# Patient Record
Sex: Female | Born: 1935 | Race: Black or African American | Hispanic: No | Marital: Single | Smoking: Never smoker
Health system: Southern US, Community
[De-identification: ages and names within clinical notes are randomized; demographics above are authoritative.]

## PROBLEM LIST (undated history)

## (undated) DIAGNOSIS — F329 Major depressive disorder, single episode, unspecified: Secondary | ICD-10-CM

## (undated) DIAGNOSIS — C50919 Malignant neoplasm of unspecified site of unspecified female breast: Secondary | ICD-10-CM

## (undated) DIAGNOSIS — E119 Type 2 diabetes mellitus without complications: Secondary | ICD-10-CM

## (undated) DIAGNOSIS — F32A Depression, unspecified: Secondary | ICD-10-CM

## (undated) DIAGNOSIS — I639 Cerebral infarction, unspecified: Secondary | ICD-10-CM

## (undated) DIAGNOSIS — I10 Essential (primary) hypertension: Secondary | ICD-10-CM

---

## 2001-08-19 DIAGNOSIS — I639 Cerebral infarction, unspecified: Secondary | ICD-10-CM

## 2001-08-19 HISTORY — DX: Cerebral infarction, unspecified: I63.9

## 2004-06-10 ENCOUNTER — Emergency Department: Payer: Self-pay | Admitting: Internal Medicine

## 2004-06-10 ENCOUNTER — Other Ambulatory Visit: Payer: Self-pay

## 2004-06-13 ENCOUNTER — Ambulatory Visit: Payer: Self-pay | Admitting: Urology

## 2004-06-18 ENCOUNTER — Ambulatory Visit: Payer: Self-pay | Admitting: *Deleted

## 2004-06-18 ENCOUNTER — Other Ambulatory Visit: Payer: Self-pay

## 2004-06-18 ENCOUNTER — Ambulatory Visit: Payer: Self-pay | Admitting: Urology

## 2004-06-21 ENCOUNTER — Ambulatory Visit: Payer: Self-pay | Admitting: Urology

## 2004-07-05 ENCOUNTER — Ambulatory Visit: Payer: Self-pay | Admitting: *Deleted

## 2004-08-16 ENCOUNTER — Ambulatory Visit: Payer: Self-pay | Admitting: General Surgery

## 2004-08-19 DIAGNOSIS — C50919 Malignant neoplasm of unspecified site of unspecified female breast: Secondary | ICD-10-CM

## 2004-08-19 HISTORY — DX: Malignant neoplasm of unspecified site of unspecified female breast: C50.919

## 2004-08-19 HISTORY — PX: MASTECTOMY: SHX3

## 2004-09-13 ENCOUNTER — Inpatient Hospital Stay: Payer: Self-pay | Admitting: General Surgery

## 2004-10-01 ENCOUNTER — Ambulatory Visit: Payer: Self-pay | Admitting: Internal Medicine

## 2004-10-03 ENCOUNTER — Other Ambulatory Visit: Payer: Self-pay

## 2004-10-17 ENCOUNTER — Ambulatory Visit: Payer: Self-pay | Admitting: Internal Medicine

## 2004-11-13 ENCOUNTER — Ambulatory Visit: Payer: Self-pay | Admitting: General Surgery

## 2004-11-17 ENCOUNTER — Ambulatory Visit: Payer: Self-pay | Admitting: Internal Medicine

## 2004-12-17 ENCOUNTER — Ambulatory Visit: Payer: Self-pay | Admitting: Internal Medicine

## 2005-01-17 ENCOUNTER — Ambulatory Visit: Payer: Self-pay | Admitting: Internal Medicine

## 2005-02-16 ENCOUNTER — Ambulatory Visit: Payer: Self-pay | Admitting: Internal Medicine

## 2005-03-19 ENCOUNTER — Ambulatory Visit: Payer: Self-pay | Admitting: Internal Medicine

## 2005-04-19 ENCOUNTER — Ambulatory Visit: Payer: Self-pay | Admitting: Internal Medicine

## 2005-06-17 ENCOUNTER — Ambulatory Visit: Payer: Self-pay | Admitting: Internal Medicine

## 2005-06-19 ENCOUNTER — Ambulatory Visit: Payer: Self-pay | Admitting: Internal Medicine

## 2005-07-15 ENCOUNTER — Ambulatory Visit: Payer: Self-pay | Admitting: General Surgery

## 2005-08-03 ENCOUNTER — Other Ambulatory Visit: Payer: Self-pay

## 2005-08-03 ENCOUNTER — Inpatient Hospital Stay: Payer: Self-pay | Admitting: Internal Medicine

## 2005-10-16 ENCOUNTER — Ambulatory Visit: Payer: Self-pay | Admitting: Internal Medicine

## 2005-10-17 ENCOUNTER — Ambulatory Visit: Payer: Self-pay | Admitting: Internal Medicine

## 2005-11-17 ENCOUNTER — Ambulatory Visit: Payer: Self-pay | Admitting: Internal Medicine

## 2005-12-17 ENCOUNTER — Ambulatory Visit: Payer: Self-pay | Admitting: Internal Medicine

## 2006-01-17 ENCOUNTER — Ambulatory Visit: Payer: Self-pay | Admitting: Internal Medicine

## 2006-02-16 ENCOUNTER — Ambulatory Visit: Payer: Self-pay | Admitting: Internal Medicine

## 2006-03-27 ENCOUNTER — Ambulatory Visit: Payer: Self-pay | Admitting: Internal Medicine

## 2006-04-19 ENCOUNTER — Ambulatory Visit: Payer: Self-pay | Admitting: Internal Medicine

## 2006-05-19 ENCOUNTER — Ambulatory Visit: Payer: Self-pay | Admitting: Internal Medicine

## 2006-06-19 ENCOUNTER — Ambulatory Visit: Payer: Self-pay | Admitting: Internal Medicine

## 2006-07-19 ENCOUNTER — Ambulatory Visit: Payer: Self-pay | Admitting: Internal Medicine

## 2006-07-28 ENCOUNTER — Ambulatory Visit: Payer: Self-pay | Admitting: General Surgery

## 2006-09-22 ENCOUNTER — Ambulatory Visit: Payer: Self-pay | Admitting: Internal Medicine

## 2006-10-18 ENCOUNTER — Ambulatory Visit: Payer: Self-pay | Admitting: Internal Medicine

## 2007-01-15 ENCOUNTER — Encounter: Payer: Self-pay | Admitting: Nurse Practitioner

## 2007-01-18 ENCOUNTER — Encounter: Payer: Self-pay | Admitting: Nurse Practitioner

## 2007-02-17 ENCOUNTER — Encounter: Payer: Self-pay | Admitting: Nurse Practitioner

## 2007-03-19 ENCOUNTER — Ambulatory Visit: Payer: Self-pay | Admitting: Internal Medicine

## 2007-03-20 ENCOUNTER — Ambulatory Visit: Payer: Self-pay | Admitting: Internal Medicine

## 2007-03-20 ENCOUNTER — Encounter: Payer: Self-pay | Admitting: Nurse Practitioner

## 2007-04-20 ENCOUNTER — Ambulatory Visit: Payer: Self-pay | Admitting: Internal Medicine

## 2007-05-20 ENCOUNTER — Ambulatory Visit: Payer: Self-pay | Admitting: Internal Medicine

## 2007-07-20 ENCOUNTER — Ambulatory Visit: Payer: Self-pay | Admitting: Internal Medicine

## 2007-08-06 ENCOUNTER — Ambulatory Visit: Payer: Self-pay | Admitting: Internal Medicine

## 2007-08-20 ENCOUNTER — Ambulatory Visit: Payer: Self-pay | Admitting: Internal Medicine

## 2007-08-26 ENCOUNTER — Ambulatory Visit: Payer: Self-pay | Admitting: General Surgery

## 2007-09-20 ENCOUNTER — Ambulatory Visit: Payer: Self-pay | Admitting: Internal Medicine

## 2007-10-18 ENCOUNTER — Ambulatory Visit: Payer: Self-pay | Admitting: Internal Medicine

## 2007-11-04 ENCOUNTER — Ambulatory Visit: Payer: Self-pay | Admitting: Internal Medicine

## 2007-11-18 ENCOUNTER — Ambulatory Visit: Payer: Self-pay | Admitting: Internal Medicine

## 2008-04-19 ENCOUNTER — Ambulatory Visit: Payer: Self-pay | Admitting: Internal Medicine

## 2008-05-02 ENCOUNTER — Ambulatory Visit: Payer: Self-pay | Admitting: Internal Medicine

## 2008-05-19 ENCOUNTER — Ambulatory Visit: Payer: Self-pay | Admitting: Internal Medicine

## 2008-10-17 ENCOUNTER — Ambulatory Visit: Payer: Self-pay | Admitting: Internal Medicine

## 2008-10-20 ENCOUNTER — Ambulatory Visit: Payer: Self-pay | Admitting: Internal Medicine

## 2008-11-17 ENCOUNTER — Ambulatory Visit: Payer: Self-pay | Admitting: Internal Medicine

## 2009-02-01 ENCOUNTER — Inpatient Hospital Stay: Payer: Self-pay | Admitting: Internal Medicine

## 2009-03-19 ENCOUNTER — Ambulatory Visit: Payer: Self-pay | Admitting: Internal Medicine

## 2009-04-06 ENCOUNTER — Ambulatory Visit: Payer: Self-pay | Admitting: Internal Medicine

## 2009-04-19 ENCOUNTER — Ambulatory Visit: Payer: Self-pay | Admitting: Internal Medicine

## 2009-09-11 ENCOUNTER — Ambulatory Visit: Payer: Self-pay | Admitting: Internal Medicine

## 2009-09-19 ENCOUNTER — Ambulatory Visit: Payer: Self-pay | Admitting: Internal Medicine

## 2009-10-05 ENCOUNTER — Ambulatory Visit: Payer: Self-pay | Admitting: Internal Medicine

## 2009-10-17 ENCOUNTER — Ambulatory Visit: Payer: Self-pay | Admitting: Internal Medicine

## 2010-01-15 ENCOUNTER — Inpatient Hospital Stay: Payer: Self-pay | Admitting: Internal Medicine

## 2010-04-02 ENCOUNTER — Inpatient Hospital Stay: Payer: Self-pay | Admitting: Internal Medicine

## 2010-09-12 ENCOUNTER — Ambulatory Visit: Payer: Self-pay | Admitting: Internal Medicine

## 2010-10-04 ENCOUNTER — Ambulatory Visit: Payer: Self-pay | Admitting: Internal Medicine

## 2010-10-05 LAB — CANCER ANTIGEN 27.29: CA 27.29: 37.1 U/mL (ref 0.0–38.6)

## 2010-10-18 ENCOUNTER — Ambulatory Visit: Payer: Self-pay | Admitting: Internal Medicine

## 2011-09-16 ENCOUNTER — Ambulatory Visit: Payer: Self-pay | Admitting: Internal Medicine

## 2011-10-24 ENCOUNTER — Ambulatory Visit: Payer: Self-pay | Admitting: Internal Medicine

## 2011-10-24 LAB — CBC CANCER CENTER
Basophil #: 0.1 x10 3/mm (ref 0.0–0.1)
Eosinophil #: 0.1 x10 3/mm (ref 0.0–0.7)
Lymphocyte #: 1.5 x10 3/mm (ref 1.0–3.6)
Lymphocyte %: 17.3 %
MCHC: 31.2 g/dL — ABNORMAL LOW (ref 32.0–36.0)
MCV: 77 fL — ABNORMAL LOW (ref 80–100)
Monocyte #: 0.6 x10 3/mm (ref 0.0–0.7)
Monocyte %: 7.3 %
Neutrophil #: 6.5 x10 3/mm (ref 1.4–6.5)
Neutrophil %: 74 %
RDW: 16.8 % — ABNORMAL HIGH (ref 11.5–14.5)

## 2011-10-24 LAB — CREATININE, SERUM
Creatinine: 0.97 mg/dL (ref 0.60–1.30)
EGFR (African American): >60

## 2011-10-24 LAB — HEPATIC FUNCTION PANEL A (ARMC)
Albumin: 3.3 g/dL — ABNORMAL LOW (ref 3.4–5.0)
Bilirubin, Direct: 0.1 mg/dL (ref 0.00–0.20)
Bilirubin,Total: 0.3 mg/dL (ref 0.2–1.0)
SGOT(AST): 17 U/L (ref 15–37)

## 2011-10-24 LAB — IRON AND TIBC
Iron Bind.Cap.(Total): 391 ug/dL (ref 250–450)
Iron: 21 ug/dL — ABNORMAL LOW (ref 50–170)

## 2011-10-24 LAB — FERRITIN: Ferritin (ARMC): 10 ng/mL (ref 8–388)

## 2011-11-13 ENCOUNTER — Ambulatory Visit: Payer: Self-pay | Admitting: Internal Medicine

## 2011-11-18 ENCOUNTER — Ambulatory Visit: Payer: Self-pay | Admitting: Internal Medicine

## 2011-11-19 ENCOUNTER — Ambulatory Visit: Payer: Self-pay | Admitting: Otolaryngology

## 2011-11-27 ENCOUNTER — Ambulatory Visit: Payer: Self-pay | Admitting: Otolaryngology

## 2011-12-19 ENCOUNTER — Ambulatory Visit: Payer: Self-pay | Admitting: Internal Medicine

## 2012-01-18 ENCOUNTER — Ambulatory Visit: Payer: Self-pay | Admitting: Internal Medicine

## 2012-03-19 ENCOUNTER — Ambulatory Visit: Payer: Self-pay | Admitting: Internal Medicine

## 2012-03-19 LAB — CBC CANCER CENTER
Basophil %: 1.2 %
Eosinophil #: 0.1 x10 3/mm (ref 0.0–0.7)
Lymphocyte #: 1.2 x10 3/mm (ref 1.0–3.6)
MCH: 28.5 pg (ref 26.0–34.0)
MCHC: 32.3 g/dL (ref 32.0–36.0)
MCV: 88 fL (ref 80–100)
Monocyte #: 0.4 x10 3/mm (ref 0.2–0.9)
Neutrophil %: 70.4 %
Platelet: 255 x10 3/mm (ref 150–440)

## 2012-03-19 LAB — HEPATIC FUNCTION PANEL A (ARMC)
Albumin: 3.5 g/dL (ref 3.4–5.0)
Bilirubin,Total: 0.3 mg/dL (ref 0.2–1.0)
SGOT(AST): 13 U/L — ABNORMAL LOW (ref 15–37)
SGPT (ALT): 22 U/L
Total Protein: 7.5 g/dL (ref 6.4–8.2)

## 2012-03-19 LAB — CREATININE, SERUM
EGFR (African American): >60
EGFR (Non-African Amer.): >60

## 2012-03-19 LAB — IRON AND TIBC
Iron Bind.Cap.(Total): 282 ug/dL (ref 250–450)
Iron: 50 ug/dL (ref 50–170)
Unbound Iron-Bind.Cap.: 232 ug/dL

## 2012-03-20 LAB — CANCER ANTIGEN 27.29: CA 27.29: 59.9 U/mL — ABNORMAL HIGH (ref 0.0–38.6)

## 2012-03-26 ENCOUNTER — Ambulatory Visit: Payer: Self-pay

## 2012-04-19 ENCOUNTER — Ambulatory Visit: Payer: Self-pay | Admitting: Internal Medicine

## 2012-06-25 ENCOUNTER — Ambulatory Visit: Payer: Self-pay | Admitting: Internal Medicine

## 2012-07-09 LAB — CBC CANCER CENTER
Basophil %: 1.1 %
Eosinophil #: 0.1 x10 3/mm (ref 0.0–0.7)
Eosinophil %: 1.4 %
HCT: 35.8 % (ref 35.0–47.0)
HGB: 12 g/dL (ref 12.0–16.0)
MCH: 29.3 pg (ref 26.0–34.0)
MCHC: 33.4 g/dL (ref 32.0–36.0)
MCV: 88 fL (ref 80–100)
Monocyte %: 7.1 %
Neutrophil #: 6.4 x10 3/mm (ref 1.4–6.5)
RBC: 4.08 10*6/uL (ref 3.80–5.20)
WBC: 8.7 x10 3/mm (ref 3.6–11.0)

## 2012-07-09 LAB — HEPATIC FUNCTION PANEL A (ARMC)
Bilirubin,Total: 0.3 mg/dL (ref 0.2–1.0)
SGPT (ALT): 18 U/L (ref 12–78)
Total Protein: 7.6 g/dL (ref 6.4–8.2)

## 2012-07-09 LAB — CREATININE, SERUM
Creatinine: 0.96 mg/dL (ref 0.60–1.30)
EGFR (African American): >60

## 2012-07-16 IMAGING — US US THYROID
1 series · 17 of 25 positions shown · non-contrast
Comparison: none

REASON FOR EXAM: thyroid nodule
COMMENTS:

[Series 1: us thyroid · 17 of 35 slices shown]
[im 1/35]
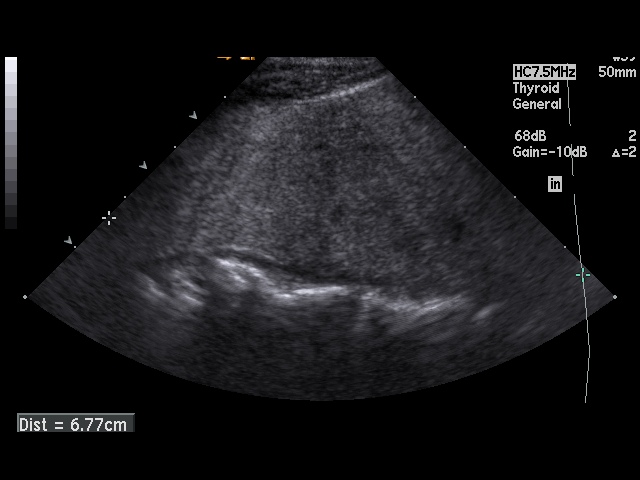
[im 3/35]
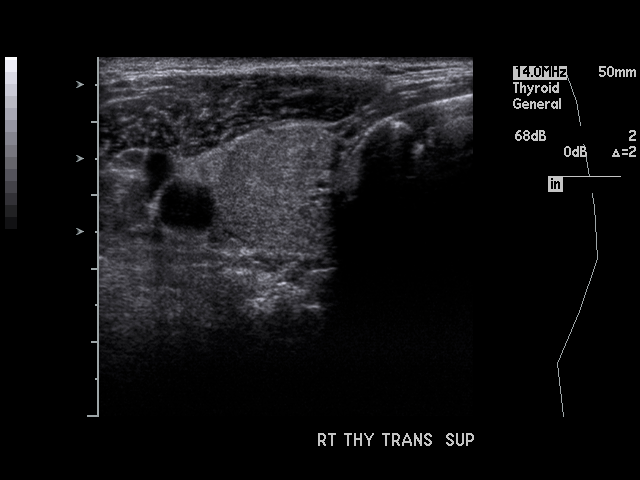
[im 5/35]
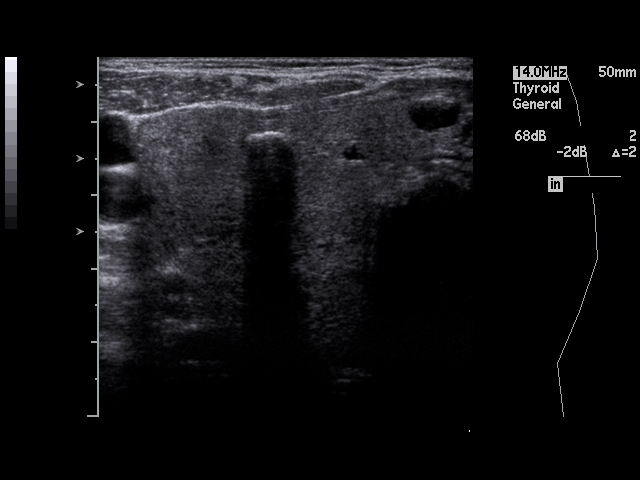
[im 8/35]
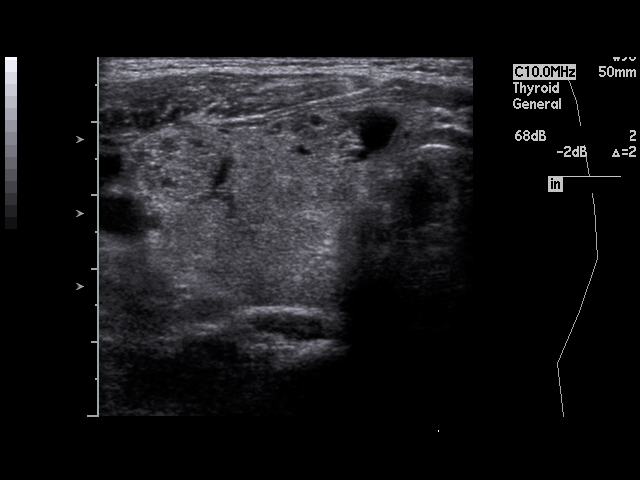
[im 9/35]
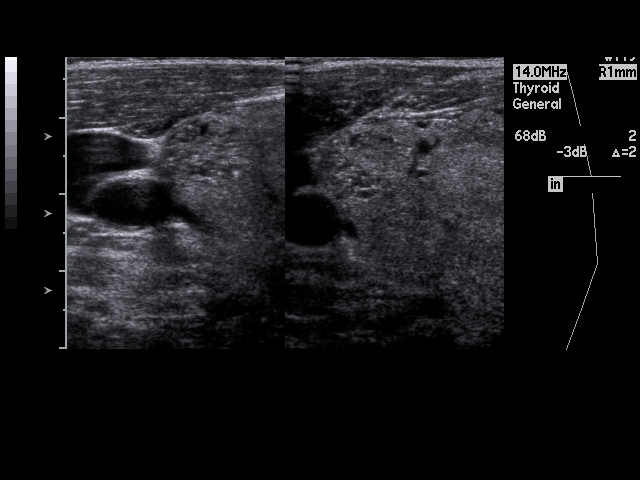
[im 12/35]
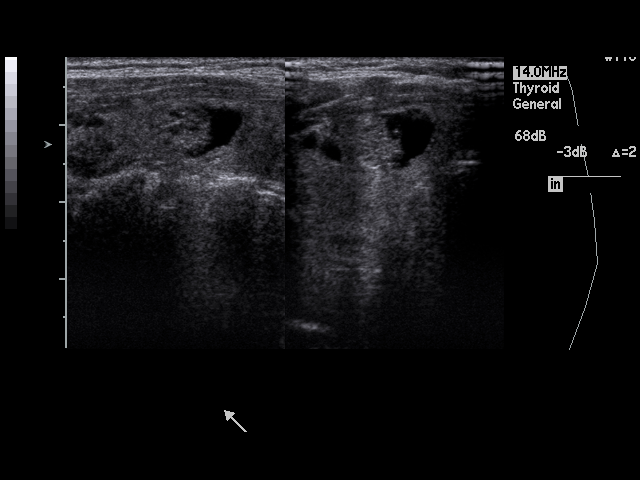
[im 13/35]
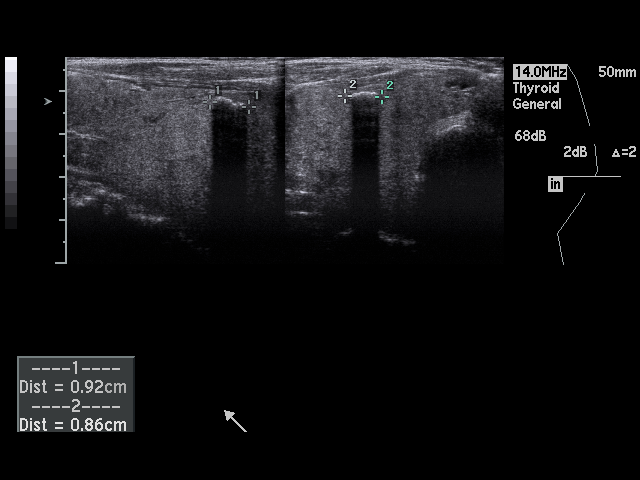
[im 16/35]
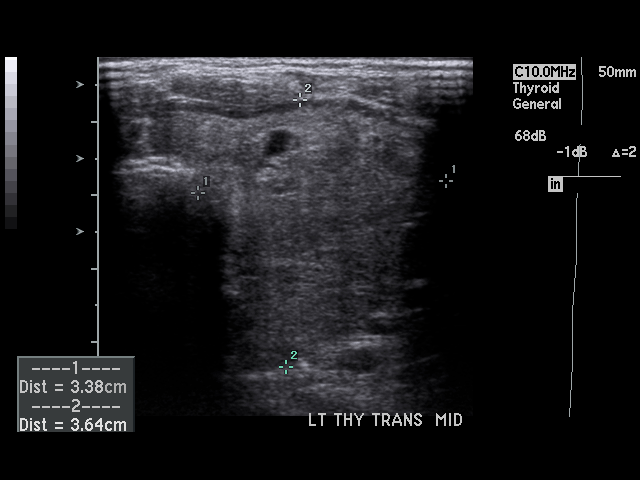
[im 18/35]
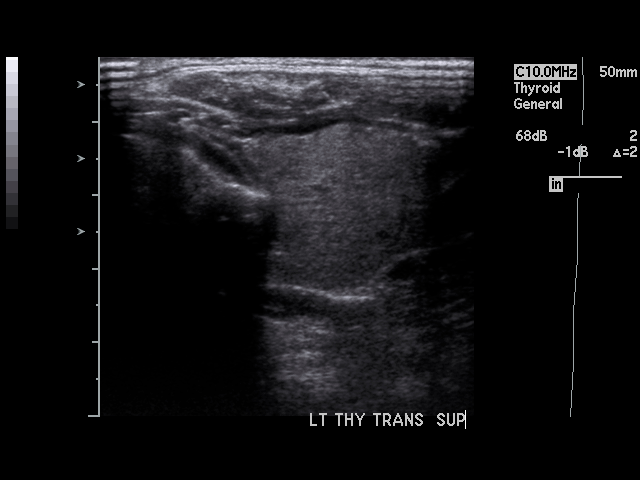
[im 19/35]
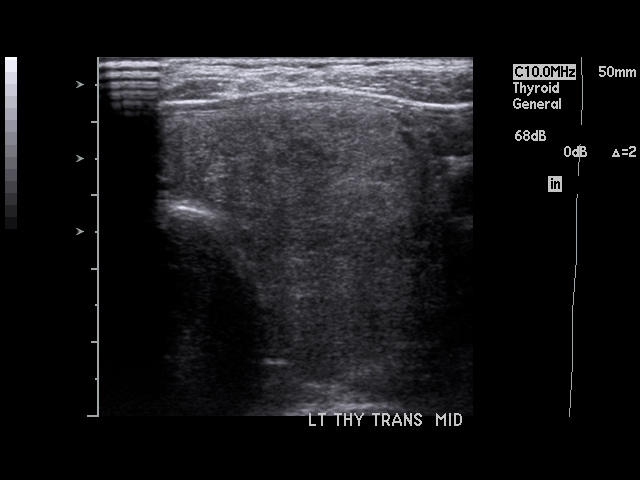
[im 22/35]
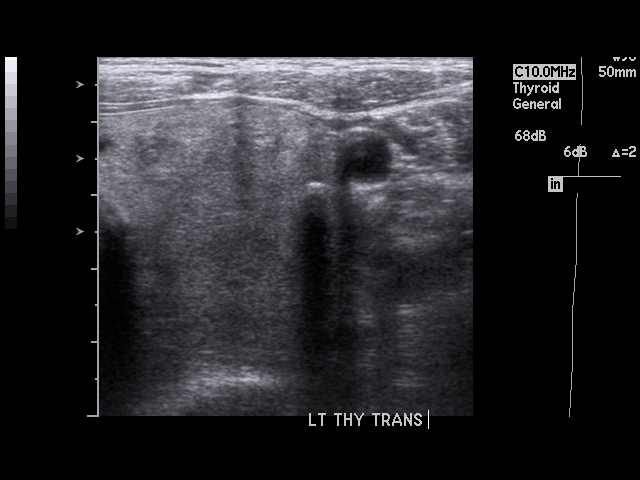
[im 23/35]
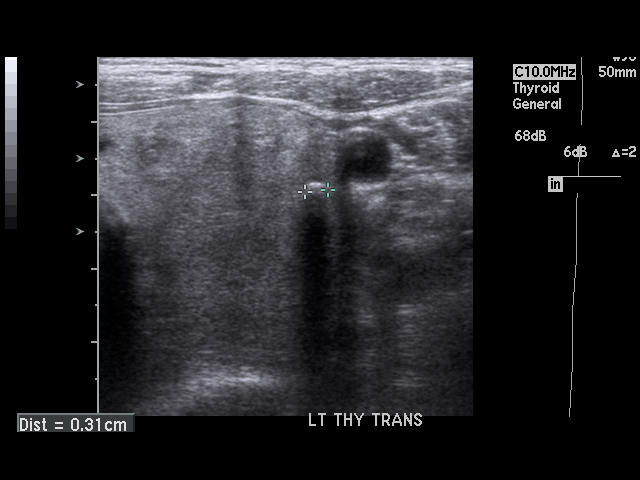
[im 26/35]
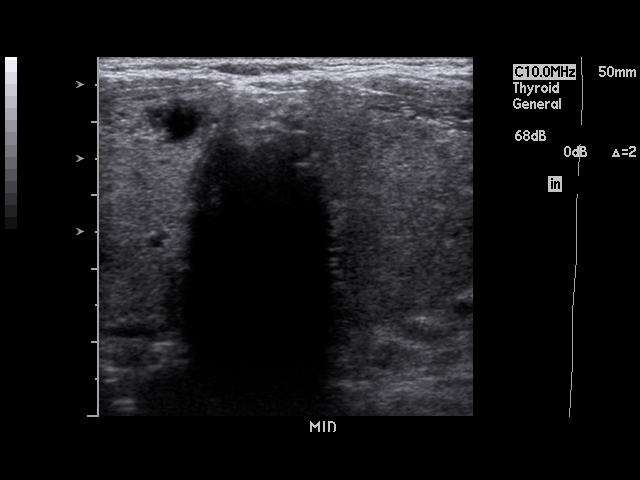
[im 27/35]
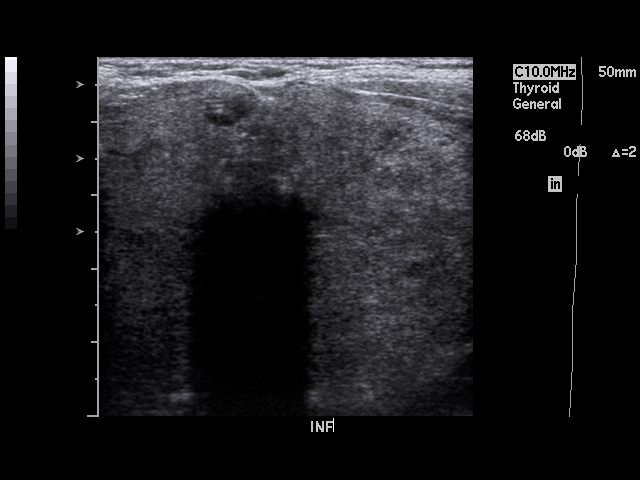
[im 30/35]
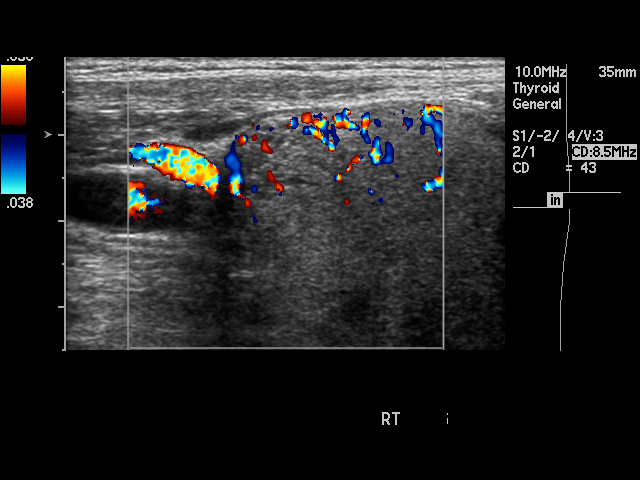
[im 32/35]
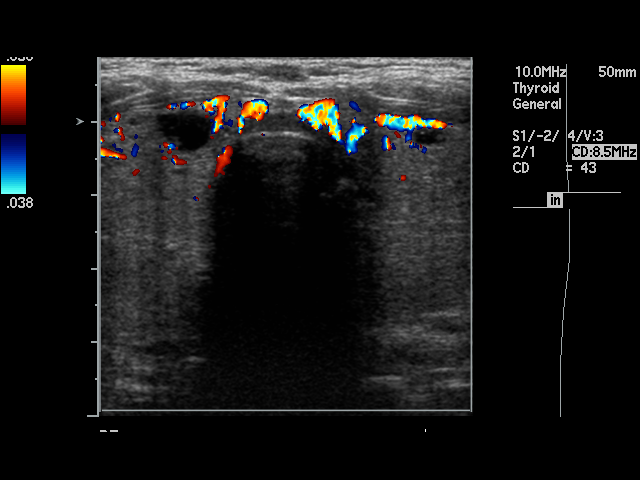
[im 35/35]
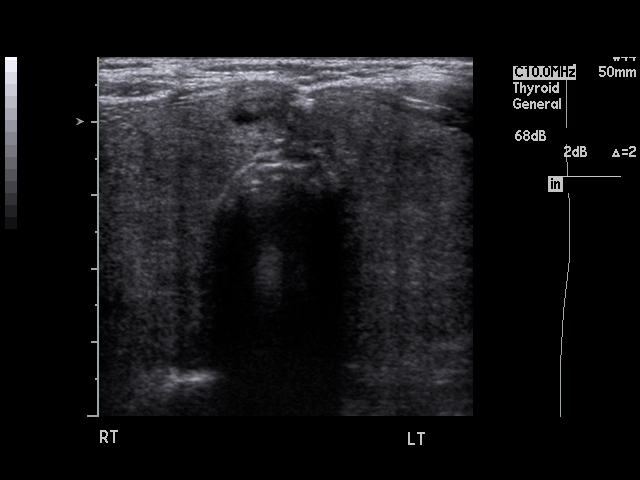

[17 of 25 positions shown; findings below may reference images not displayed]

PROCEDURE:     US  - US THYROID  - November 27, 2011 [DATE]

RESULT:     The right lobe of the thyroid measures 6.7 cm x 3 cm x 3.5 cm
and the left lobe measures 6.7 cm x 3.3 cm x 3.6 cm. There is a coarse
mm calcification in the isthmus. At the lower pole of the right lobe there
is a coarse 9.2 mm calcification and in the left lobe there is a focal
mm calcification. No associated mass is identified with any of the
calcifications. In the midpole region of the right lobe there is a
hypoechoic, solid-appearing 1.56 cm oval nodule containing a few tiny cystic
areas but with no internal calcifications seen. More medially in the right
lobe there is observed a 1.62 cm complex mass containing a few more
prominent cystic areas. No associated microcalcifications are seen in the
second nodule. No additional nodules or masses are seen. The thyroid
echotexture bilaterally and appears mildly heterogeneous.
IMPRESSION: 1. The thyroid lobes appear large and heterogeneous.
2. There are two small nodules in the midpole of the right lobe as mentioned
above. No additional defined thyroid nodules are seen.
3. There are a few coarse, benign-appearing calcifications observed in each
lobe and in the isthmus.

## 2012-07-19 ENCOUNTER — Ambulatory Visit: Payer: Self-pay | Admitting: Internal Medicine

## 2012-08-19 ENCOUNTER — Ambulatory Visit: Payer: Self-pay | Admitting: Internal Medicine

## 2012-08-27 ENCOUNTER — Other Ambulatory Visit (HOSPITAL_COMMUNITY): Payer: Self-pay | Admitting: Cardiothoracic Surgery

## 2012-08-27 DIAGNOSIS — J9859 Other diseases of mediastinum, not elsewhere classified: Secondary | ICD-10-CM

## 2012-09-17 ENCOUNTER — Other Ambulatory Visit: Payer: Self-pay | Admitting: Radiology

## 2012-09-18 ENCOUNTER — Ambulatory Visit (HOSPITAL_COMMUNITY): Payer: Medicare Other

## 2012-09-18 ENCOUNTER — Ambulatory Visit (HOSPITAL_COMMUNITY): Payer: Self-pay

## 2012-09-18 ENCOUNTER — Encounter (HOSPITAL_COMMUNITY): Payer: Self-pay | Admitting: Pharmacist

## 2012-09-19 ENCOUNTER — Ambulatory Visit: Payer: Self-pay | Admitting: Internal Medicine

## 2012-09-21 ENCOUNTER — Encounter (HOSPITAL_COMMUNITY): Payer: Self-pay

## 2012-09-21 ENCOUNTER — Ambulatory Visit (HOSPITAL_COMMUNITY)
Admission: RE | Admit: 2012-09-21 | Discharge: 2012-09-21 | Disposition: A | Discharge status: Home / Self Care | Payer: Medicare (Managed Care) | Source: Ambulatory Visit | Attending: Cardiothoracic Surgery | Admitting: Cardiothoracic Surgery

## 2012-09-21 DIAGNOSIS — I10 Essential (primary) hypertension: Secondary | ICD-10-CM | POA: Insufficient documentation

## 2012-09-21 DIAGNOSIS — Z79899 Other long term (current) drug therapy: Secondary | ICD-10-CM | POA: Insufficient documentation

## 2012-09-21 DIAGNOSIS — Z853 Personal history of malignant neoplasm of breast: Secondary | ICD-10-CM | POA: Insufficient documentation

## 2012-09-21 DIAGNOSIS — Z8673 Personal history of transient ischemic attack (TIA), and cerebral infarction without residual deficits: Secondary | ICD-10-CM | POA: Insufficient documentation

## 2012-09-21 DIAGNOSIS — J9859 Other diseases of mediastinum, not elsewhere classified: Secondary | ICD-10-CM

## 2012-09-21 DIAGNOSIS — C781 Secondary malignant neoplasm of mediastinum: Secondary | ICD-10-CM | POA: Insufficient documentation

## 2012-09-21 DIAGNOSIS — E119 Type 2 diabetes mellitus without complications: Secondary | ICD-10-CM | POA: Insufficient documentation

## 2012-09-21 HISTORY — DX: Depression, unspecified: F32.A

## 2012-09-21 HISTORY — DX: Type 2 diabetes mellitus without complications: E11.9

## 2012-09-21 HISTORY — DX: Major depressive disorder, single episode, unspecified: F32.9

## 2012-09-21 HISTORY — DX: Malignant neoplasm of unspecified site of unspecified female breast: C50.919

## 2012-09-21 HISTORY — DX: Cerebral infarction, unspecified: I63.9

## 2012-09-21 HISTORY — DX: Essential (primary) hypertension: I10

## 2012-09-21 LAB — CBC
HCT: 37.4 % (ref 36.0–46.0)
Hemoglobin: 12.1 g/dL (ref 12.0–15.0)
MCH: 28.1 pg (ref 26.0–34.0)
MCV: 87 fL (ref 78.0–100.0)
RBC: 4.3 MIL/uL (ref 3.87–5.11)
WBC: 7 10*3/uL (ref 4.0–10.5)

## 2012-09-21 LAB — GLUCOSE, CAPILLARY: Glucose-Capillary: 115 mg/dL — ABNORMAL HIGH (ref 70–99)

## 2012-09-21 MED ORDER — SODIUM CHLORIDE 0.9 % IV SOLN: Freq: Once | INTRAVENOUS | Status: DC

## 2012-09-21 MED ORDER — MIDAZOLAM HCL 2 MG/2ML IJ SOLN
INTRAMUSCULAR | Status: AC
  Filled 2012-09-21: qty 6

## 2012-09-21 MED ORDER — HYDROCODONE-ACETAMINOPHEN 5-325 MG PO TABS
1.0000 | ORAL_TABLET | ORAL | Status: DC | PRN
  Filled 2012-09-21: qty 2

## 2012-09-21 MED ORDER — MIDAZOLAM HCL 2 MG/2ML IJ SOLN
INTRAMUSCULAR | Status: AC | PRN
  Administered 2012-09-21: 1 mg via INTRAVENOUS
  Administered 2012-09-21: 0.5 mg via INTRAVENOUS
  Administered 2012-09-21: 1 mg via INTRAVENOUS

## 2012-09-21 MED ORDER — FENTANYL CITRATE 0.05 MG/ML IJ SOLN
INTRAMUSCULAR | Status: AC
  Filled 2012-09-21: qty 6

## 2012-09-21 MED ORDER — FENTANYL CITRATE 0.05 MG/ML IJ SOLN
INTRAMUSCULAR | Status: AC | PRN
  Administered 2012-09-21 (×3): 50 ug via INTRAVENOUS

## 2012-09-21 NOTE — H&P (Signed)
Agree.  For biopsy of anterior mediastinal mass today.

## 2012-09-21 NOTE — Procedures (Signed)
Procedure:  CT guided core biopsy of mediastinal mass Findings:  3 cm anterior mediastinal mass.  17 G needle advanced under CT guidance from right parasternal approach.  Solid 18 G core biopsies x 3 submitted in saline.

## 2012-09-21 NOTE — H&P (Signed)
Chief Complaint: "I'm here for a biopsy" Referring Physician:[facility] HPI: Brittany Camacho is an 79 y.o. female with hx of breast cancer on the right. She is now found to have rising tumor markers and CT imaging finds a retrosternal mass. She has been referred for biopsy and imaging has been reviewed and pt scheduled. She reports feeling well otherwise. PMHx and meds reviewed. She has held her Plavix since 1/27.  Past Medical History:  Past Medical History  Diagnosis Date  . Breast cancer 2006    right  . CVA (cerebral infarction) 2003    (L)hemiparesis, mostly recovered  . Diabetes mellitus   . Hypertension   . Depression     Past Surgical History:  Past Surgical History  Procedure Date  . Mastectomy 2006    right    Family History:  Family History  Problem Relation Age of Onset  . Breast cancer      Social History:  reports that she has never smoked. She has never used smokeless tobacco. She reports that she does not drink alcohol or use illicit drugs.  Allergies: No Known Allergies  Medications: acetaminophen (TYLENOL) 500 MG tablet (Taking) Sig - Route: Take 500 mg by mouth every 8 (eight) hours as needed. Pain - Oral Class: Historical Med Number of times this order has been changed since signing: 1 [address] atorvastatin (LIPITOR) 40 MG tablet (Taking) Sig - Route: Take 40 mg by mouth daily. - Oral Class: Historical Med Number of times this order has been changed since signing: 1 Order [facility] clopidogrel (PLAVIX) 75 MG tablet (Taking) Sig - Route: Take 75 mg by mouth daily. - Oral Class: Historical Med Number of times this order has been changed since signing: 1 Order [facility] ferrous sulfate 325 (65 FE) MG tablet (Taking) Sig - Route: Take 650 mg by mouth daily with breakfast. - Oral Class: Historical Med Number of times this order has been changed since signing: 1 Order [facility] glipiZIDE (GLUCOTROL XL) 10 MG 24 hr tablet (Taking) Sig - Route: Take 10 mg by  mouth daily with breakfast. - Oral Class: Historical Med Number of times this order has been changed since signing: 1 Order [facility] [address] guaiFENesin-dextromethorphan (ROBITUSSIN DM) 100-10 MG/5ML syrup (Taking) Sig - Route: Take 5 mLs by mouth every 4 (four) hours as needed. cough - Oral Class: Historical Med Number of times this order has been changed since signing: 1 Order [facility] [address] HYDROcodone-acetaminophen (NORCO/VICODIN) 5-325 MG per tablet (Taking) Sig - Route: Take 1 tablet by mouth at bedtime as needed. For shoulder pain. - Oral Class: Historical Med Number of times this order has been changed since signing: 1 Order [facility] insulin glargine (LANTUS) 100 UNIT/ML injection (Taking) Sig - Route: Inject 28 Units into the skin daily. - Subcutaneous Class: Historical Med Number of times this order has been changed since signing: 1 Order [facility] [address] insulin regular (NOVOLIN R,HUMULIN R) 100 units/mL injection (Taking) Sig - Route: Inject 5 Units into the skin as needed. "5 units if sugar is greater than 300 in clinic." - Subcutaneous Class: Historical Med Number of times this order has been changed since signing: 1 Order [facility] [address] lisinopril-hydrochlorothiazide (PRINZIDE,ZESTORETIC) 20-12.5 MG per tablet (Taking) Sig - Route: Take 1 tablet by mouth daily. - Oral Class: Historical Med Number of times this order has been changed since signing: 1 Order [facility] loperamide (IMODIUM A-D) 2 MG tablet (Taking) Sig - Route: Take 2-4 mg by mouth See admin instructions.  Takes one tablet every day, then also takes 1-2 tablets after each loose stool as needed. - Oral Class: Historical Med Number of times this order has been changed since signing: 1 Order [facility] metoprolol succinate (TOPROL-XL) 25 MG 24 hr tablet (Taking) Sig - Route: Take 25 mg by mouth daily. - Oral Class: Historical Med Number of times this order has been changed since signing: 1 Order [facility] pantoprazole (PROTONIX) 40 MG tablet (Taking)  Sig - Route: Take 40 mg by mouth daily before breakfast. - Oral Class: Historical Med Number of times this order has been changed since signing: 1 Order [address] potassium chloride (K-DUR,KLOR-CON) 10 MEQ tablet (Taking) Sig - Route: Take 10 mEq by mouth daily. - Oral Class: Historical Med Number of times this order has been changed since signing: 1 Order [facility] [address] Vitamin D, Ergocalciferol, (DRISDOL) 50000 UNITS CAPS (Taking) Sig - Route: Take 50,000 Units by mouth every 30 (thirty) days. Takes on the first Monday of each month. - Oral   Please HPI for pertinent positives, otherwise complete 10 system ROS negative.  Physical Exam: Blood pressure 121/68, pulse 79, temperature 97.3 F (36.3 C), temperature source Oral, resp. rate 16, height 5\' 7"  (1.702 m), weight 172 lb (78.019 kg), SpO2 92.00%. Body mass index is 26.94 kg/(m^2).   General Appearance:  Alert, cooperative, no distress, appears stated age  Head:  Normocephalic, without obvious abnormality, atraumatic  ENT: Unremarkable  Neck: Supple, symmetrical, trachea midline, palpable NT thyroid enlarged  Lungs:   Clear to auscultation bilaterally, no w/r/r, respirations unlabored without use of accessory muscles.  Chest Wall:  No tenderness or deformity  Heart:  Regular rate and rhythm, S1, S2 normal, no murmur, rub or gallop. Carotids 2+ without bruit.  Neurologic: Normal affect, no gross deficits.   Results for orders placed during the hospital encounter of 09/21/12 (from the past 48 hour(s))  CBC     Status: Normal   Collection Time   09/21/12 10:10 AM      Component Value Range Comment   WBC 7.0  4.0 - 10.5 K/uL    RBC 4.30  3.87 - 5.11 MIL/uL    Hemoglobin 12.1  12.0 - 15.0 g/dL    HCT 16.1  09.6 - 04.5 %    MCV 87.0  78.0 - 100.0 fL    MCH 28.1  26.0 - 34.0 pg    MCHC 32.4  30.0 - 36.0 g/dL    RDW 40.9  81.1 - 91.4 %    Platelets 291  150 - 400 K/uL    No results found.  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 Discussed procedure and risks, complications, use of sedation. Labs pending, for review. Consent signed in chart  Brayton El PA-C 09/21/2012, 10:52 AM

## 2012-09-22 ENCOUNTER — Ambulatory Visit (HOSPITAL_COMMUNITY): Payer: Medicare Other

## 2012-09-22 ENCOUNTER — Ambulatory Visit (HOSPITAL_COMMUNITY)
Admission: RE | Admit: 2012-09-22 | Discharge: 2012-09-22 | Disposition: A | Discharge status: Home / Self Care | Payer: Medicare Other | Source: Ambulatory Visit | Attending: Cardiothoracic Surgery | Admitting: Cardiothoracic Surgery

## 2012-10-15 LAB — CBC CANCER CENTER
Eosinophil #: 0.1 x10 3/mm (ref 0.0–0.7)
Eosinophil %: 1.7 %
HCT: 36.2 % (ref 35.0–47.0)
HGB: 11.8 g/dL — ABNORMAL LOW (ref 12.0–16.0)
Lymphocyte #: 1.5 x10 3/mm (ref 1.0–3.6)
MCH: 28.1 pg (ref 26.0–34.0)
MCHC: 32.6 g/dL (ref 32.0–36.0)
MCV: 86 fL (ref 80–100)
Monocyte #: 0.6 x10 3/mm (ref 0.2–0.9)
Neutrophil %: 68.9 %
Platelet: 241 x10 3/mm (ref 150–440)
RBC: 4.18 10*6/uL (ref 3.80–5.20)
RDW: 13.6 % (ref 11.5–14.5)
WBC: 7.6 x10 3/mm (ref 3.6–11.0)

## 2012-10-15 LAB — HEPATIC FUNCTION PANEL A (ARMC)
Albumin: 3.4 g/dL (ref 3.4–5.0)
Alkaline Phosphatase: 88 U/L (ref 50–136)
Bilirubin, Direct: 0.1 mg/dL (ref 0.00–0.20)
SGPT (ALT): 22 U/L (ref 12–78)
Total Protein: 7.6 g/dL (ref 6.4–8.2)

## 2012-10-15 LAB — CREATININE, SERUM
Creatinine: 0.99 mg/dL (ref 0.60–1.30)
EGFR (African American): >60

## 2012-10-15 LAB — IRON AND TIBC
Iron Bind.Cap.(Total): 274 ug/dL (ref 250–450)
Iron: 77 ug/dL (ref 50–170)

## 2012-10-17 ENCOUNTER — Ambulatory Visit: Payer: Self-pay | Admitting: Internal Medicine

## 2013-01-14 ENCOUNTER — Ambulatory Visit: Payer: Self-pay | Admitting: Internal Medicine

## 2013-01-14 LAB — CBC CANCER CENTER
Basophil %: 0.9 %
Eosinophil #: 0.1 x10 3/mm (ref 0.0–0.7)
Eosinophil %: 1 %
HGB: 11.1 g/dL — ABNORMAL LOW (ref 12.0–16.0)
MCH: 27.4 pg (ref 26.0–34.0)
MCHC: 31.3 g/dL — ABNORMAL LOW (ref 32.0–36.0)
Neutrophil %: 76.2 %
Platelet: 251 x10 3/mm (ref 150–440)
RBC: 4.04 10*6/uL (ref 3.80–5.20)
RDW: 13.8 % (ref 11.5–14.5)

## 2013-01-14 LAB — CREATININE, SERUM
Creatinine: 0.96 mg/dL (ref 0.60–1.30)
EGFR (African American): >60

## 2013-01-14 LAB — HEPATIC FUNCTION PANEL A (ARMC)
Albumin: 3.4 g/dL (ref 3.4–5.0)
Alkaline Phosphatase: 75 U/L (ref 50–136)
Bilirubin, Direct: 0.1 mg/dL (ref 0.00–0.20)
SGOT(AST): 27 U/L (ref 15–37)
SGPT (ALT): 32 U/L (ref 12–78)

## 2013-01-17 ENCOUNTER — Ambulatory Visit: Payer: Self-pay | Admitting: Internal Medicine

## 2013-02-16 ENCOUNTER — Ambulatory Visit: Payer: Self-pay | Admitting: Internal Medicine

## 2013-04-15 ENCOUNTER — Ambulatory Visit: Payer: Self-pay | Admitting: Internal Medicine

## 2013-04-15 LAB — CREATININE, SERUM
Creatinine: 1.11 mg/dL (ref 0.60–1.30)
EGFR (African American): 56 — ABNORMAL LOW
EGFR (Non-African Amer.): 48 — ABNORMAL LOW

## 2013-04-15 LAB — CBC CANCER CENTER
Basophil #: 0.1 x10 3/mm (ref 0.0–0.1)
Basophil %: 1.1 %
Eosinophil %: 1.2 %
HGB: 11.8 g/dL — ABNORMAL LOW (ref 12.0–16.0)
Lymphocyte #: 1.3 x10 3/mm (ref 1.0–3.6)
MCH: 27.8 pg (ref 26.0–34.0)
MCHC: 32 g/dL (ref 32.0–36.0)
MCV: 87 fL (ref 80–100)
Monocyte #: 0.6 x10 3/mm (ref 0.2–0.9)
Monocyte %: 7.9 %
Neutrophil #: 6 x10 3/mm (ref 1.4–6.5)
Neutrophil %: 73.5 %
Platelet: 258 x10 3/mm (ref 150–440)
RBC: 4.23 10*6/uL (ref 3.80–5.20)
WBC: 8.2 x10 3/mm (ref 3.6–11.0)

## 2013-04-15 LAB — HEPATIC FUNCTION PANEL A (ARMC)
Bilirubin, Direct: 0.1 mg/dL (ref 0.00–0.20)
Bilirubin,Total: 0.4 mg/dL (ref 0.2–1.0)

## 2013-04-19 ENCOUNTER — Ambulatory Visit: Payer: Self-pay | Admitting: Internal Medicine

## 2013-07-06 ENCOUNTER — Ambulatory Visit: Payer: Self-pay | Admitting: Internal Medicine

## 2013-07-06 LAB — CBC CANCER CENTER
Basophil #: 0.1 x10 3/mm (ref 0.0–0.1)
Basophil %: 1 %
HGB: 10.4 g/dL — ABNORMAL LOW (ref 12.0–16.0)
Lymphocyte #: 1.6 x10 3/mm (ref 1.0–3.6)
Lymphocyte %: 17 %
MCHC: 31.3 g/dL — ABNORMAL LOW (ref 32.0–36.0)
MCV: 90 fL (ref 80–100)
Monocyte #: 0.9 x10 3/mm (ref 0.2–0.9)
Monocyte %: 9.3 %
Neutrophil #: 6.7 x10 3/mm — ABNORMAL HIGH (ref 1.4–6.5)
Neutrophil %: 71.2 %
Platelet: 307 x10 3/mm (ref 150–440)
RBC: 3.71 10*6/uL — ABNORMAL LOW (ref 3.80–5.20)
RDW: 14 % (ref 11.5–14.5)

## 2013-07-06 LAB — CREATININE, SERUM
Creatinine: 1.18 mg/dL (ref 0.60–1.30)
EGFR (African American): 52 — ABNORMAL LOW

## 2013-07-06 LAB — HEPATIC FUNCTION PANEL A (ARMC)
Alkaline Phosphatase: 77 U/L (ref 50–136)
SGOT(AST): 22 U/L (ref 15–37)
Total Protein: 7.5 g/dL (ref 6.4–8.2)

## 2013-07-19 ENCOUNTER — Ambulatory Visit: Payer: Self-pay | Admitting: Internal Medicine

## 2013-08-04 LAB — CBC CANCER CENTER
Basophil %: 1.2 %
Eosinophil #: 0.1 x10 3/mm (ref 0.0–0.7)
HCT: 34.4 % — ABNORMAL LOW (ref 35.0–47.0)
Lymphocyte #: 1 x10 3/mm (ref 1.0–3.6)
Lymphocyte %: 11.9 %
MCH: 27.9 pg (ref 26.0–34.0)
MCV: 88 fL (ref 80–100)
Platelet: 277 x10 3/mm (ref 150–440)
RBC: 3.9 10*6/uL (ref 3.80–5.20)
RDW: 13.7 % (ref 11.5–14.5)

## 2013-08-04 LAB — HEPATIC FUNCTION PANEL A (ARMC)
Alkaline Phosphatase: 68 U/L
SGOT(AST): 28 U/L (ref 15–37)
SGPT (ALT): 28 U/L (ref 12–78)

## 2013-08-04 LAB — CREATININE, SERUM: Creatinine: 1.44 mg/dL — ABNORMAL HIGH (ref 0.60–1.30)

## 2013-08-10 LAB — CBC CANCER CENTER
Basophil #: 0 x10 3/mm (ref 0.0–0.1)
Eosinophil #: 0.3 x10 3/mm (ref 0.0–0.7)
HCT: 35 % (ref 35.0–47.0)
HGB: 10.9 g/dL — ABNORMAL LOW (ref 12.0–16.0)
Lymphocyte #: 0.8 x10 3/mm — ABNORMAL LOW (ref 1.0–3.6)
Lymphocyte %: 11.2 %
MCH: 27.5 pg (ref 26.0–34.0)
Neutrophil %: 76.7 %
Platelet: 271 x10 3/mm (ref 150–440)
RDW: 13.4 % (ref 11.5–14.5)
WBC: 7.3 x10 3/mm (ref 3.6–11.0)

## 2013-08-17 LAB — CBC CANCER CENTER
Basophil #: 0.1 x10 3/mm (ref 0.0–0.1)
Eosinophil #: 0.3 x10 3/mm (ref 0.0–0.7)
Eosinophil %: 4 %
HGB: 10.4 g/dL — ABNORMAL LOW (ref 12.0–16.0)
Lymphocyte #: 0.6 x10 3/mm — ABNORMAL LOW (ref 1.0–3.6)
Lymphocyte %: 8.6 %
MCH: 27.6 pg (ref 26.0–34.0)
MCHC: 31.4 g/dL — ABNORMAL LOW (ref 32.0–36.0)
Monocyte #: 0.7 x10 3/mm (ref 0.2–0.9)
Monocyte %: 9 %
Neutrophil %: 77.6 %
Platelet: 248 x10 3/mm (ref 150–440)
RDW: 13.4 % (ref 11.5–14.5)
WBC: 7.6 x10 3/mm (ref 3.6–11.0)

## 2013-08-19 ENCOUNTER — Ambulatory Visit: Payer: Self-pay | Admitting: Internal Medicine

## 2013-08-31 LAB — CBC CANCER CENTER
BASOS PCT: 0.8 %
Basophil #: 0.1 x10 3/mm (ref 0.0–0.1)
Eosinophil #: 0.2 x10 3/mm (ref 0.0–0.7)
Eosinophil %: 2.9 %
HCT: 34.6 % — ABNORMAL LOW (ref 35.0–47.0)
HGB: 11 g/dL — ABNORMAL LOW (ref 12.0–16.0)
LYMPHS PCT: 7.5 %
Lymphocyte #: 0.5 x10 3/mm — ABNORMAL LOW (ref 1.0–3.6)
MCH: 27.6 pg (ref 26.0–34.0)
MCHC: 31.8 g/dL — ABNORMAL LOW (ref 32.0–36.0)
MCV: 87 fL (ref 80–100)
MONO ABS: 0.6 x10 3/mm (ref 0.2–0.9)
Monocyte %: 8.9 %
Neutrophil #: 5.7 x10 3/mm (ref 1.4–6.5)
Neutrophil %: 79.9 %
PLATELETS: 237 x10 3/mm (ref 150–440)
RBC: 4 10*6/uL (ref 3.80–5.20)
RDW: 14 % (ref 11.5–14.5)
WBC: 7.1 x10 3/mm (ref 3.6–11.0)

## 2013-09-19 ENCOUNTER — Ambulatory Visit: Payer: Self-pay | Admitting: Internal Medicine

## 2013-09-29 LAB — CREATININE, SERUM
CREATININE: 0.91 mg/dL (ref 0.60–1.30)
EGFR (Non-African Amer.): >60

## 2013-09-29 LAB — CBC CANCER CENTER
BASOS PCT: 1.5 %
Basophil #: 0.1 x10 3/mm (ref 0.0–0.1)
EOS ABS: 0.2 x10 3/mm (ref 0.0–0.7)
EOS PCT: 4.1 %
HCT: 36.3 % (ref 35.0–47.0)
HGB: 11.5 g/dL — ABNORMAL LOW (ref 12.0–16.0)
Lymphocyte #: 0.6 x10 3/mm — ABNORMAL LOW (ref 1.0–3.6)
Lymphocyte %: 12.7 %
MCH: 27.3 pg (ref 26.0–34.0)
MCHC: 31.6 g/dL — ABNORMAL LOW (ref 32.0–36.0)
MCV: 86 fL (ref 80–100)
Monocyte #: 0.6 x10 3/mm (ref 0.2–0.9)
Monocyte %: 11.7 %
Neutrophil #: 3.4 x10 3/mm (ref 1.4–6.5)
Neutrophil %: 70 %
PLATELETS: 237 x10 3/mm (ref 150–440)
RBC: 4.21 10*6/uL (ref 3.80–5.20)
RDW: 14.2 % (ref 11.5–14.5)
WBC: 4.9 x10 3/mm (ref 3.6–11.0)

## 2013-09-29 LAB — HEPATIC FUNCTION PANEL A (ARMC)
ALK PHOS: 70 U/L
ALT: 25 U/L (ref 12–78)
Albumin: 3.4 g/dL (ref 3.4–5.0)
BILIRUBIN DIRECT: 0.1 mg/dL (ref 0.00–0.20)
BILIRUBIN TOTAL: 0.2 mg/dL (ref 0.2–1.0)
SGOT(AST): 27 U/L (ref 15–37)
Total Protein: 7.8 g/dL (ref 6.4–8.2)

## 2013-10-17 ENCOUNTER — Ambulatory Visit: Payer: Self-pay | Admitting: Internal Medicine

## 2013-10-28 LAB — CANCER ANTIGEN 27.29: CA 27.29: 283.5 U/mL — AB (ref 0.0–38.6)

## 2013-11-16 LAB — CBC CANCER CENTER
Basophil #: 0.1 x10 3/mm (ref 0.0–0.1)
Basophil %: 1.4 %
Eosinophil #: 0.3 x10 3/mm (ref 0.0–0.7)
Eosinophil %: 4.8 %
HCT: 35.5 % (ref 35.0–47.0)
HGB: 11.2 g/dL — AB (ref 12.0–16.0)
LYMPHS ABS: 0.8 x10 3/mm — AB (ref 1.0–3.6)
LYMPHS PCT: 11.3 %
MCH: 27.4 pg (ref 26.0–34.0)
MCHC: 31.6 g/dL — AB (ref 32.0–36.0)
MCV: 87 fL (ref 80–100)
MONOS PCT: 9.8 %
Monocyte #: 0.7 x10 3/mm (ref 0.2–0.9)
NEUTROS PCT: 72.7 %
Neutrophil #: 4.9 x10 3/mm (ref 1.4–6.5)
PLATELETS: 240 x10 3/mm (ref 150–440)
RBC: 4.09 10*6/uL (ref 3.80–5.20)
RDW: 15 % — ABNORMAL HIGH (ref 11.5–14.5)
WBC: 6.8 x10 3/mm (ref 3.6–11.0)

## 2013-11-16 LAB — COMPREHENSIVE METABOLIC PANEL
ALK PHOS: 68 U/L
Albumin: 3.4 g/dL (ref 3.4–5.0)
Anion Gap: 9 (ref 7–16)
BUN: 17 mg/dL (ref 7–18)
Bilirubin,Total: 0.4 mg/dL (ref 0.2–1.0)
CO2: 29 mmol/L (ref 21–32)
Calcium, Total: 10 mg/dL (ref 8.5–10.1)
Chloride: 104 mmol/L (ref 98–107)
Creatinine: 0.99 mg/dL (ref 0.60–1.30)
EGFR (Non-African Amer.): 55 — ABNORMAL LOW
Glucose: 166 mg/dL — ABNORMAL HIGH (ref 65–99)
OSMOLALITY: 288 (ref 275–301)
Potassium: 3.9 mmol/L (ref 3.5–5.1)
SGOT(AST): 24 U/L (ref 15–37)
SGPT (ALT): 18 U/L (ref 12–78)
Sodium: 142 mmol/L (ref 136–145)
TOTAL PROTEIN: 7.8 g/dL (ref 6.4–8.2)

## 2013-11-17 ENCOUNTER — Ambulatory Visit: Payer: Self-pay | Admitting: Internal Medicine

## 2013-11-23 LAB — CBC CANCER CENTER
BASOS PCT: 1.2 %
Basophil #: 0.1 x10 3/mm (ref 0.0–0.1)
EOS ABS: 0.1 x10 3/mm (ref 0.0–0.7)
Eosinophil %: 1.4 %
HCT: 34.4 % — ABNORMAL LOW (ref 35.0–47.0)
HGB: 10.8 g/dL — ABNORMAL LOW (ref 12.0–16.0)
LYMPHS PCT: 13.7 %
Lymphocyte #: 0.9 x10 3/mm — ABNORMAL LOW (ref 1.0–3.6)
MCH: 27.4 pg (ref 26.0–34.0)
MCHC: 31.4 g/dL — ABNORMAL LOW (ref 32.0–36.0)
MCV: 87 fL (ref 80–100)
Monocyte #: 0.4 x10 3/mm (ref 0.2–0.9)
Monocyte %: 5.9 %
NEUTROS ABS: 4.9 x10 3/mm (ref 1.4–6.5)
Neutrophil %: 77.8 %
PLATELETS: 267 x10 3/mm (ref 150–440)
RBC: 3.94 10*6/uL (ref 3.80–5.20)
RDW: 14.4 % (ref 11.5–14.5)
WBC: 6.3 x10 3/mm (ref 3.6–11.0)

## 2013-12-07 LAB — CBC CANCER CENTER
BASOS ABS: 0.1 x10 3/mm (ref 0.0–0.1)
BASOS PCT: 1.3 %
Eosinophil #: 0 x10 3/mm (ref 0.0–0.7)
Eosinophil %: 0.5 %
HCT: 33.8 % — AB (ref 35.0–47.0)
HGB: 10.7 g/dL — AB (ref 12.0–16.0)
Lymphocyte #: 0.8 x10 3/mm — ABNORMAL LOW (ref 1.0–3.6)
Lymphocyte %: 14.5 %
MCH: 27.5 pg (ref 26.0–34.0)
MCHC: 31.6 g/dL — ABNORMAL LOW (ref 32.0–36.0)
MCV: 87 fL (ref 80–100)
MONO ABS: 0.8 x10 3/mm (ref 0.2–0.9)
MONOS PCT: 14.4 %
Neutrophil #: 3.7 x10 3/mm (ref 1.4–6.5)
Neutrophil %: 69.3 %
PLATELETS: 260 x10 3/mm (ref 150–440)
RBC: 3.88 10*6/uL (ref 3.80–5.20)
RDW: 14.9 % — AB (ref 11.5–14.5)
WBC: 5.4 x10 3/mm (ref 3.6–11.0)

## 2013-12-07 LAB — HEPATIC FUNCTION PANEL A (ARMC)
ALT: 26 U/L (ref 12–78)
Albumin: 3.3 g/dL — ABNORMAL LOW (ref 3.4–5.0)
Alkaline Phosphatase: 60 U/L
BILIRUBIN DIRECT: 0.1 mg/dL (ref 0.00–0.20)
Bilirubin,Total: 0.3 mg/dL (ref 0.2–1.0)
SGOT(AST): 35 U/L (ref 15–37)
Total Protein: 7.2 g/dL (ref 6.4–8.2)

## 2013-12-07 LAB — CREATININE, SERUM
CREATININE: 1.62 mg/dL — AB (ref 0.60–1.30)
GFR CALC AF AMER: 35 — AB
GFR CALC NON AF AMER: 30 — AB

## 2013-12-17 ENCOUNTER — Ambulatory Visit: Payer: Self-pay | Admitting: Internal Medicine

## 2013-12-28 LAB — CBC CANCER CENTER
BASOS ABS: 0.1 x10 3/mm (ref 0.0–0.1)
Basophil %: 0.8 %
EOS ABS: 0 x10 3/mm (ref 0.0–0.7)
Eosinophil %: 0.4 %
HCT: 35.8 % (ref 35.0–47.0)
HGB: 11.6 g/dL — ABNORMAL LOW (ref 12.0–16.0)
Lymphocyte #: 0.9 x10 3/mm — ABNORMAL LOW (ref 1.0–3.6)
Lymphocyte %: 13.8 %
MCH: 28.4 pg (ref 26.0–34.0)
MCHC: 32.5 g/dL (ref 32.0–36.0)
MCV: 88 fL (ref 80–100)
MONOS PCT: 14 %
Monocyte #: 0.9 x10 3/mm (ref 0.2–0.9)
NEUTROS PCT: 71 %
Neutrophil #: 4.4 x10 3/mm (ref 1.4–6.5)
Platelet: 283 x10 3/mm (ref 150–440)
RBC: 4.08 10*6/uL (ref 3.80–5.20)
RDW: 15.2 % — ABNORMAL HIGH (ref 11.5–14.5)
WBC: 6.2 x10 3/mm (ref 3.6–11.0)

## 2013-12-28 LAB — HEPATIC FUNCTION PANEL A (ARMC)
ALBUMIN: 3.5 g/dL (ref 3.4–5.0)
ALK PHOS: 68 U/L
Bilirubin, Direct: 0.1 mg/dL (ref 0.00–0.20)
Bilirubin,Total: 0.4 mg/dL (ref 0.2–1.0)
SGOT(AST): 39 U/L — ABNORMAL HIGH (ref 15–37)
SGPT (ALT): 25 U/L (ref 12–78)
Total Protein: 7.7 g/dL (ref 6.4–8.2)

## 2013-12-28 LAB — CREATININE, SERUM
CREATININE: 1.01 mg/dL (ref 0.60–1.30)
EGFR (African American): >60
EGFR (Non-African Amer.): 54 — ABNORMAL LOW

## 2013-12-28 LAB — CALCIUM: Calcium, Total: 9.3 mg/dL (ref 8.5–10.1)

## 2014-01-17 ENCOUNTER — Ambulatory Visit: Payer: Self-pay | Admitting: Internal Medicine

## 2014-01-18 LAB — CBC CANCER CENTER
Basophil #: 0.1 x10 3/mm (ref 0.0–0.1)
Basophil %: 1.2 %
Eosinophil #: 0.1 x10 3/mm (ref 0.0–0.7)
Eosinophil %: 2.1 %
HCT: 33.2 % — ABNORMAL LOW (ref 35.0–47.0)
HGB: 10.4 g/dL — AB (ref 12.0–16.0)
Lymphocyte #: 0.7 x10 3/mm — ABNORMAL LOW (ref 1.0–3.6)
Lymphocyte %: 11 %
MCH: 28.3 pg (ref 26.0–34.0)
MCHC: 31.2 g/dL — ABNORMAL LOW (ref 32.0–36.0)
MCV: 91 fL (ref 80–100)
MONOS PCT: 10.5 %
Monocyte #: 0.6 x10 3/mm (ref 0.2–0.9)
Neutrophil #: 4.7 x10 3/mm (ref 1.4–6.5)
Neutrophil %: 75.2 %
Platelet: 263 x10 3/mm (ref 150–440)
RBC: 3.66 10*6/uL — AB (ref 3.80–5.20)
RDW: 15.3 % — ABNORMAL HIGH (ref 11.5–14.5)
WBC: 6.2 x10 3/mm (ref 3.6–11.0)

## 2014-01-18 LAB — HEPATIC FUNCTION PANEL A (ARMC)
ALT: 21 U/L (ref 12–78)
Albumin: 3.2 g/dL — ABNORMAL LOW (ref 3.4–5.0)
Alkaline Phosphatase: 62 U/L
Bilirubin, Direct: 0.1 mg/dL (ref 0.00–0.20)
Bilirubin,Total: 0.2 mg/dL (ref 0.2–1.0)
SGOT(AST): 30 U/L (ref 15–37)
Total Protein: 7 g/dL (ref 6.4–8.2)

## 2014-01-18 LAB — CREATININE, SERUM
CREATININE: 0.97 mg/dL (ref 0.60–1.30)
EGFR (African American): >60
EGFR (Non-African Amer.): 56 — ABNORMAL LOW

## 2014-02-08 LAB — HEPATIC FUNCTION PANEL A (ARMC)
ALK PHOS: 64 U/L
ALT: 24 U/L (ref 12–78)
AST: 28 U/L (ref 15–37)
Albumin: 3.4 g/dL (ref 3.4–5.0)
BILIRUBIN TOTAL: 0.2 mg/dL (ref 0.2–1.0)
TOTAL PROTEIN: 7.1 g/dL (ref 6.4–8.2)

## 2014-02-08 LAB — CBC CANCER CENTER
Basophil #: 0.1 x10 3/mm (ref 0.0–0.1)
Basophil %: 1.3 %
Eosinophil #: 0.1 x10 3/mm (ref 0.0–0.7)
Eosinophil %: 1.5 %
HCT: 34.4 % — ABNORMAL LOW (ref 35.0–47.0)
HGB: 10.9 g/dL — AB (ref 12.0–16.0)
Lymphocyte #: 0.8 x10 3/mm — ABNORMAL LOW (ref 1.0–3.6)
Lymphocyte %: 11 %
MCH: 28.6 pg (ref 26.0–34.0)
MCHC: 31.8 g/dL — ABNORMAL LOW (ref 32.0–36.0)
MCV: 90 fL (ref 80–100)
Monocyte #: 0.6 x10 3/mm (ref 0.2–0.9)
Monocyte %: 7.9 %
NEUTROS ABS: 5.9 x10 3/mm (ref 1.4–6.5)
Neutrophil %: 78.3 %
Platelet: 288 x10 3/mm (ref 150–440)
RBC: 3.83 10*6/uL (ref 3.80–5.20)
RDW: 14.8 % — AB (ref 11.5–14.5)
WBC: 7.6 x10 3/mm (ref 3.6–11.0)

## 2014-02-08 LAB — CREATININE, SERUM
Creatinine: 1.22 mg/dL (ref 0.60–1.30)
EGFR (African American): 49 — ABNORMAL LOW
GFR CALC NON AF AMER: 43 — AB

## 2014-02-16 ENCOUNTER — Ambulatory Visit: Payer: Self-pay | Admitting: Internal Medicine

## 2014-03-01 LAB — CBC CANCER CENTER
Basophil #: 0.1 x10 3/mm (ref 0.0–0.1)
Basophil %: 1.1 %
EOS PCT: 1 %
Eosinophil #: 0.1 x10 3/mm (ref 0.0–0.7)
HCT: 31.7 % — ABNORMAL LOW (ref 35.0–47.0)
HGB: 10.3 g/dL — AB (ref 12.0–16.0)
Lymphocyte #: 1.1 x10 3/mm (ref 1.0–3.6)
Lymphocyte %: 12.5 %
MCH: 29.1 pg (ref 26.0–34.0)
MCHC: 32.3 g/dL (ref 32.0–36.0)
MCV: 90 fL (ref 80–100)
MONO ABS: 0.7 x10 3/mm (ref 0.2–0.9)
MONOS PCT: 8.2 %
NEUTROS PCT: 77.2 %
Neutrophil #: 6.7 x10 3/mm — ABNORMAL HIGH (ref 1.4–6.5)
Platelet: 270 x10 3/mm (ref 150–440)
RBC: 3.53 10*6/uL — AB (ref 3.80–5.20)
RDW: 14.5 % (ref 11.5–14.5)
WBC: 8.7 x10 3/mm (ref 3.6–11.0)

## 2014-03-01 LAB — HEPATIC FUNCTION PANEL A (ARMC)
ALBUMIN: 3.3 g/dL — AB (ref 3.4–5.0)
ALK PHOS: 57 U/L
Bilirubin, Direct: <0.1 mg/dL (ref 0.00–0.20)
Bilirubin,Total: 0.3 mg/dL (ref 0.2–1.0)
SGOT(AST): 28 U/L (ref 15–37)
SGPT (ALT): 24 U/L (ref 12–78)
Total Protein: 7.4 g/dL (ref 6.4–8.2)

## 2014-03-01 LAB — CREATININE, SERUM
CREATININE: 1.02 mg/dL (ref 0.60–1.30)
GFR CALC NON AF AMER: 53 — AB

## 2014-03-08 LAB — CBC CANCER CENTER
Basophil #: 0.1 x10 3/mm (ref 0.0–0.1)
Basophil %: 1.2 %
EOS PCT: 1.4 %
Eosinophil #: 0.1 x10 3/mm (ref 0.0–0.7)
HCT: 30 % — ABNORMAL LOW (ref 35.0–47.0)
HGB: 9.3 g/dL — AB (ref 12.0–16.0)
LYMPHS ABS: 0.9 x10 3/mm — AB (ref 1.0–3.6)
LYMPHS PCT: 12.2 %
MCH: 27.8 pg (ref 26.0–34.0)
MCHC: 31.1 g/dL — AB (ref 32.0–36.0)
MCV: 89 fL (ref 80–100)
MONOS PCT: 4.1 %
Monocyte #: 0.3 x10 3/mm (ref 0.2–0.9)
NEUTROS PCT: 81.1 %
Neutrophil #: 6.2 x10 3/mm (ref 1.4–6.5)
PLATELETS: 293 x10 3/mm (ref 150–440)
RBC: 3.36 10*6/uL — AB (ref 3.80–5.20)
RDW: 14.5 % (ref 11.5–14.5)
WBC: 7.7 x10 3/mm (ref 3.6–11.0)

## 2014-03-08 LAB — CREATININE, SERUM
Creatinine: 1.06 mg/dL (ref 0.60–1.30)
GFR CALC AF AMER: 59 — AB
GFR CALC NON AF AMER: 51 — AB

## 2014-03-09 LAB — CANCER ANTIGEN 27.29: CA 27.29: 265.1 U/mL — ABNORMAL HIGH (ref 0.0–38.6)

## 2014-03-19 ENCOUNTER — Ambulatory Visit: Payer: Self-pay | Admitting: Internal Medicine

## 2014-03-23 LAB — CBC CANCER CENTER
Basophil #: 0.1 x10 3/mm (ref 0.0–0.1)
Basophil %: 1.4 %
EOS ABS: 0 x10 3/mm (ref 0.0–0.7)
Eosinophil %: 0.5 %
HCT: 31 % — ABNORMAL LOW (ref 35.0–47.0)
HGB: 9.9 g/dL — ABNORMAL LOW (ref 12.0–16.0)
LYMPHS PCT: 12.8 %
Lymphocyte #: 0.9 x10 3/mm — ABNORMAL LOW (ref 1.0–3.6)
MCH: 28.7 pg (ref 26.0–34.0)
MCHC: 31.9 g/dL — AB (ref 32.0–36.0)
MCV: 90 fL (ref 80–100)
MONO ABS: 0.7 x10 3/mm (ref 0.2–0.9)
MONOS PCT: 10.3 %
NEUTROS ABS: 5 x10 3/mm (ref 1.4–6.5)
Neutrophil %: 75 %
Platelet: 273 x10 3/mm (ref 150–440)
RBC: 3.45 10*6/uL — AB (ref 3.80–5.20)
RDW: 15.5 % — AB (ref 11.5–14.5)
WBC: 6.7 x10 3/mm (ref 3.6–11.0)

## 2014-03-23 LAB — HEPATIC FUNCTION PANEL A (ARMC)
ALBUMIN: 3.1 g/dL — AB (ref 3.4–5.0)
ALK PHOS: 53 U/L
ALT: 21 U/L
BILIRUBIN TOTAL: 0.3 mg/dL (ref 0.2–1.0)
SGOT(AST): 34 U/L (ref 15–37)
TOTAL PROTEIN: 7.1 g/dL (ref 6.4–8.2)

## 2014-03-23 LAB — CREATININE, SERUM
CREATININE: 1.47 mg/dL — AB (ref 0.60–1.30)
EGFR (Non-African Amer.): 34 — ABNORMAL LOW
GFR CALC AF AMER: 39 — AB

## 2014-03-30 LAB — CBC CANCER CENTER
Basophil #: 0.1 x10 3/mm (ref 0.0–0.1)
Basophil %: 0.9 %
Eosinophil #: 0.1 x10 3/mm (ref 0.0–0.7)
Eosinophil %: 0.8 %
HCT: 32.2 % — AB (ref 35.0–47.0)
HGB: 10 g/dL — ABNORMAL LOW (ref 12.0–16.0)
LYMPHS ABS: 0.9 x10 3/mm — AB (ref 1.0–3.6)
LYMPHS PCT: 12.4 %
MCH: 28 pg (ref 26.0–34.0)
MCHC: 31.1 g/dL — AB (ref 32.0–36.0)
MCV: 90 fL (ref 80–100)
MONOS PCT: 3.7 %
Monocyte #: 0.3 x10 3/mm (ref 0.2–0.9)
NEUTROS PCT: 82.2 %
Neutrophil #: 6.1 x10 3/mm (ref 1.4–6.5)
Platelet: 311 x10 3/mm (ref 150–440)
RBC: 3.58 10*6/uL — AB (ref 3.80–5.20)
RDW: 15.1 % — ABNORMAL HIGH (ref 11.5–14.5)
WBC: 7.4 x10 3/mm (ref 3.6–11.0)

## 2014-03-30 LAB — CREATININE, SERUM
Creatinine: 1.04 mg/dL (ref 0.60–1.30)
EGFR (African American): >60
GFR CALC NON AF AMER: 52 — AB

## 2014-04-13 LAB — HEPATIC FUNCTION PANEL A (ARMC)
ALT: 21 U/L
AST: 29 U/L (ref 15–37)
Albumin: 3 g/dL — ABNORMAL LOW (ref 3.4–5.0)
Alkaline Phosphatase: 57 U/L
BILIRUBIN TOTAL: 0.2 mg/dL (ref 0.2–1.0)
Bilirubin, Direct: <0.1 mg/dL (ref 0.00–0.20)
Total Protein: 6.9 g/dL (ref 6.4–8.2)

## 2014-04-13 LAB — CBC CANCER CENTER
Basophil #: 0.1 x10 3/mm (ref 0.0–0.1)
Basophil %: 1.4 %
Eosinophil #: 0.1 x10 3/mm (ref 0.0–0.7)
Eosinophil %: 1.3 %
HCT: 31.4 % — ABNORMAL LOW (ref 35.0–47.0)
HGB: 10 g/dL — ABNORMAL LOW (ref 12.0–16.0)
Lymphocyte #: 0.9 x10 3/mm — ABNORMAL LOW (ref 1.0–3.6)
Lymphocyte %: 14.5 %
MCH: 28.5 pg (ref 26.0–34.0)
MCHC: 31.8 g/dL — ABNORMAL LOW (ref 32.0–36.0)
MCV: 90 fL (ref 80–100)
Monocyte #: 0.7 x10 3/mm (ref 0.2–0.9)
Monocyte %: 11.1 %
Neutrophil #: 4.7 x10 3/mm (ref 1.4–6.5)
Neutrophil %: 71.7 %
Platelet: 291 x10 3/mm (ref 150–440)
RBC: 3.51 10*6/uL — ABNORMAL LOW (ref 3.80–5.20)
RDW: 15.9 % — ABNORMAL HIGH (ref 11.5–14.5)
WBC: 6.5 x10 3/mm (ref 3.6–11.0)

## 2014-04-13 LAB — CREATININE, SERUM
Creatinine: 1.33 mg/dL — ABNORMAL HIGH (ref 0.60–1.30)
EGFR (Non-African Amer.): 38 — ABNORMAL LOW
GFR CALC AF AMER: 45 — AB

## 2014-04-19 ENCOUNTER — Ambulatory Visit: Payer: Self-pay | Admitting: Internal Medicine

## 2014-04-19 LAB — CBC CANCER CENTER
BASOS ABS: 0.1 x10 3/mm (ref 0.0–0.1)
Basophil %: 1.2 %
EOS PCT: 2.3 %
Eosinophil #: 0.2 x10 3/mm (ref 0.0–0.7)
HCT: 31.7 % — ABNORMAL LOW (ref 35.0–47.0)
HGB: 10 g/dL — ABNORMAL LOW (ref 12.0–16.0)
LYMPHS ABS: 1.1 x10 3/mm (ref 1.0–3.6)
LYMPHS PCT: 10.1 %
MCH: 28.3 pg (ref 26.0–34.0)
MCHC: 31.4 g/dL — AB (ref 32.0–36.0)
MCV: 90 fL (ref 80–100)
MONO ABS: 0.4 x10 3/mm (ref 0.2–0.9)
MONOS PCT: 3.3 %
Neutrophil #: 9 x10 3/mm — ABNORMAL HIGH (ref 1.4–6.5)
Neutrophil %: 83.1 %
Platelet: 281 x10 3/mm (ref 150–440)
RBC: 3.52 10*6/uL — ABNORMAL LOW (ref 3.80–5.20)
RDW: 15.3 % — ABNORMAL HIGH (ref 11.5–14.5)
WBC: 10.8 x10 3/mm (ref 3.6–11.0)

## 2014-04-19 LAB — CREATININE, SERUM
Creatinine: 1.49 mg/dL — ABNORMAL HIGH (ref 0.60–1.30)
GFR CALC AF AMER: 39 — AB
GFR CALC NON AF AMER: 34 — AB

## 2014-05-04 LAB — CBC CANCER CENTER
BASOS PCT: 1.3 %
Basophil #: 0.1 x10 3/mm (ref 0.0–0.1)
EOS PCT: 0.4 %
Eosinophil #: 0 x10 3/mm (ref 0.0–0.7)
HCT: 31.8 % — ABNORMAL LOW (ref 35.0–47.0)
HGB: 10 g/dL — ABNORMAL LOW (ref 12.0–16.0)
LYMPHS ABS: 1 x10 3/mm (ref 1.0–3.6)
Lymphocyte %: 11.5 %
MCH: 28.2 pg (ref 26.0–34.0)
MCHC: 31.5 g/dL — ABNORMAL LOW (ref 32.0–36.0)
MCV: 90 fL (ref 80–100)
Monocyte #: 1 x10 3/mm — ABNORMAL HIGH (ref 0.2–0.9)
Monocyte %: 11.3 %
Neutrophil #: 6.7 x10 3/mm — ABNORMAL HIGH (ref 1.4–6.5)
Neutrophil %: 75.5 %
Platelet: 305 x10 3/mm (ref 150–440)
RBC: 3.56 10*6/uL — AB (ref 3.80–5.20)
RDW: 14.9 % — ABNORMAL HIGH (ref 11.5–14.5)
WBC: 8.9 x10 3/mm (ref 3.6–11.0)

## 2014-05-04 LAB — HEPATIC FUNCTION PANEL A (ARMC)
ALK PHOS: 68 U/L
Albumin: 3 g/dL — ABNORMAL LOW (ref 3.4–5.0)
BILIRUBIN TOTAL: 0.2 mg/dL (ref 0.2–1.0)
SGOT(AST): 21 U/L (ref 15–37)
SGPT (ALT): 21 U/L
Total Protein: 7.1 g/dL (ref 6.4–8.2)

## 2014-05-04 LAB — CREATININE, SERUM
Creatinine: 1.35 mg/dL — ABNORMAL HIGH (ref 0.60–1.30)
EGFR (African American): 44 — ABNORMAL LOW
EGFR (Non-African Amer.): 38 — ABNORMAL LOW

## 2014-05-13 LAB — CBC CANCER CENTER
Basophil #: 0.1 x10 3/mm (ref 0.0–0.1)
Basophil %: 1.1 %
EOS ABS: 0 x10 3/mm (ref 0.0–0.7)
Eosinophil %: 0.7 %
HCT: 32.6 % — ABNORMAL LOW (ref 35.0–47.0)
HGB: 10.2 g/dL — AB (ref 12.0–16.0)
Lymphocyte #: 1.1 x10 3/mm (ref 1.0–3.6)
Lymphocyte %: 14.8 %
MCH: 27.6 pg (ref 26.0–34.0)
MCHC: 31.2 g/dL — AB (ref 32.0–36.0)
MCV: 88 fL (ref 80–100)
MONO ABS: 0.5 x10 3/mm (ref 0.2–0.9)
Monocyte %: 7.4 %
Neutrophil #: 5.5 x10 3/mm (ref 1.4–6.5)
Neutrophil %: 76 %
PLATELETS: 313 x10 3/mm (ref 150–440)
RBC: 3.68 10*6/uL — ABNORMAL LOW (ref 3.80–5.20)
RDW: 15.3 % — AB (ref 11.5–14.5)
WBC: 7.2 x10 3/mm (ref 3.6–11.0)

## 2014-05-16 LAB — CANCER ANTIGEN 27.29: CA 27.29: 298.5 U/mL — ABNORMAL HIGH (ref 0.0–38.6)

## 2014-05-19 ENCOUNTER — Ambulatory Visit: Payer: Self-pay | Admitting: Internal Medicine

## 2014-05-25 LAB — CBC CANCER CENTER
BASOS ABS: 0.1 x10 3/mm (ref 0.0–0.1)
BASOS PCT: 0.9 %
Eosinophil #: 0 x10 3/mm (ref 0.0–0.7)
Eosinophil %: 0.7 %
HCT: 33.2 % — ABNORMAL LOW (ref 35.0–47.0)
HGB: 10.3 g/dL — ABNORMAL LOW (ref 12.0–16.0)
LYMPHS PCT: 16.7 %
Lymphocyte #: 0.9 x10 3/mm — ABNORMAL LOW (ref 1.0–3.6)
MCH: 27.3 pg (ref 26.0–34.0)
MCHC: 31 g/dL — ABNORMAL LOW (ref 32.0–36.0)
MCV: 88 fL (ref 80–100)
MONOS PCT: 14.2 %
Monocyte #: 0.8 x10 3/mm (ref 0.2–0.9)
NEUTROS ABS: 3.8 x10 3/mm (ref 1.4–6.5)
Neutrophil %: 67.5 %
PLATELETS: 334 x10 3/mm (ref 150–440)
RBC: 3.76 10*6/uL — AB (ref 3.80–5.20)
RDW: 15.2 % — AB (ref 11.5–14.5)
WBC: 5.7 x10 3/mm (ref 3.6–11.0)

## 2014-06-01 LAB — CBC CANCER CENTER
Basophil #: 0.1 x10 3/mm (ref 0.0–0.1)
Basophil %: 1.3 %
EOS ABS: 0.1 x10 3/mm (ref 0.0–0.7)
Eosinophil %: 0.9 %
HCT: 30.4 % — AB (ref 35.0–47.0)
HGB: 9.7 g/dL — AB (ref 12.0–16.0)
LYMPHS ABS: 1 x10 3/mm (ref 1.0–3.6)
LYMPHS PCT: 13.2 %
MCH: 27.7 pg (ref 26.0–34.0)
MCHC: 31.9 g/dL — AB (ref 32.0–36.0)
MCV: 87 fL (ref 80–100)
MONOS PCT: 4.1 %
Monocyte #: 0.3 x10 3/mm (ref 0.2–0.9)
Neutrophil #: 5.9 x10 3/mm (ref 1.4–6.5)
Neutrophil %: 80.5 %
Platelet: 311 x10 3/mm (ref 150–440)
RBC: 3.5 10*6/uL — ABNORMAL LOW (ref 3.80–5.20)
RDW: 15.1 % — AB (ref 11.5–14.5)
WBC: 7.3 x10 3/mm (ref 3.6–11.0)

## 2014-06-15 LAB — CBC CANCER CENTER
BASOS ABS: 0.1 x10 3/mm (ref 0.0–0.1)
Basophil %: 2 %
EOS PCT: 0.6 %
Eosinophil #: 0 x10 3/mm (ref 0.0–0.7)
HCT: 33.3 % — AB (ref 35.0–47.0)
HGB: 10.3 g/dL — ABNORMAL LOW (ref 12.0–16.0)
Lymphocyte #: 1 x10 3/mm (ref 1.0–3.6)
Lymphocyte %: 21.1 %
MCH: 27.4 pg (ref 26.0–34.0)
MCHC: 31 g/dL — ABNORMAL LOW (ref 32.0–36.0)
MCV: 89 fL (ref 80–100)
Monocyte #: 0.6 x10 3/mm (ref 0.2–0.9)
Monocyte %: 13.3 %
NEUTROS PCT: 63 %
Neutrophil #: 2.9 x10 3/mm (ref 1.4–6.5)
PLATELETS: 320 x10 3/mm (ref 150–440)
RBC: 3.76 10*6/uL — ABNORMAL LOW (ref 3.80–5.20)
RDW: 16.1 % — ABNORMAL HIGH (ref 11.5–14.5)
WBC: 4.6 x10 3/mm (ref 3.6–11.0)

## 2014-06-15 LAB — CREATININE, SERUM
CREATININE: 1.13 mg/dL (ref 0.60–1.30)
EGFR (African American): >60
EGFR (Non-African Amer.): 50 — ABNORMAL LOW

## 2014-06-15 LAB — HEPATIC FUNCTION PANEL A (ARMC)
ALT: 17 U/L
Albumin: 3.2 g/dL — ABNORMAL LOW (ref 3.4–5.0)
Alkaline Phosphatase: 62 U/L
BILIRUBIN DIRECT: 0.1 mg/dL (ref 0.0–0.2)
Bilirubin,Total: 0.3 mg/dL (ref 0.2–1.0)
SGOT(AST): 18 U/L (ref 15–37)
TOTAL PROTEIN: 7.1 g/dL (ref 6.4–8.2)

## 2014-06-18 ENCOUNTER — Emergency Department: Payer: Self-pay | Admitting: Emergency Medicine

## 2014-06-18 LAB — BASIC METABOLIC PANEL
Anion Gap: 6 — ABNORMAL LOW (ref 7–16)
BUN: 12 mg/dL (ref 7–18)
CHLORIDE: 106 mmol/L (ref 98–107)
CO2: 30 mmol/L (ref 21–32)
Calcium, Total: 8.5 mg/dL (ref 8.5–10.1)
Creatinine: 0.76 mg/dL (ref 0.60–1.30)
EGFR (Non-African Amer.): >60
Glucose: 221 mg/dL — ABNORMAL HIGH (ref 65–99)
OSMOLALITY: 290 (ref 275–301)
Potassium: 3.4 mmol/L — ABNORMAL LOW (ref 3.5–5.1)
Sodium: 142 mmol/L (ref 136–145)

## 2014-06-18 LAB — CBC WITH DIFFERENTIAL/PLATELET
BASOS PCT: 1.1 %
Basophil #: 0.1 10*3/uL (ref 0.0–0.1)
EOS ABS: 0 10*3/uL (ref 0.0–0.7)
EOS PCT: 0.6 %
HCT: 30.9 % — AB (ref 35.0–47.0)
HGB: 9.7 g/dL — ABNORMAL LOW (ref 12.0–16.0)
LYMPHS ABS: 0.7 10*3/uL — AB (ref 1.0–3.6)
LYMPHS PCT: 11.1 %
MCH: 28 pg (ref 26.0–34.0)
MCHC: 31.5 g/dL — ABNORMAL LOW (ref 32.0–36.0)
MCV: 89 fL (ref 80–100)
MONO ABS: 0.4 x10 3/mm (ref 0.2–0.9)
Monocyte %: 7 %
NEUTROS PCT: 80.2 %
Neutrophil #: 5.1 10*3/uL (ref 1.4–6.5)
PLATELETS: 260 10*3/uL (ref 150–440)
RBC: 3.48 10*6/uL — AB (ref 3.80–5.20)
RDW: 15.7 % — AB (ref 11.5–14.5)
WBC: 6.4 10*3/uL (ref 3.6–11.0)

## 2014-06-18 LAB — MAGNESIUM: Magnesium: 1.2 mg/dL — ABNORMAL LOW

## 2014-06-19 ENCOUNTER — Ambulatory Visit: Payer: Self-pay | Admitting: Internal Medicine

## 2014-06-22 LAB — CBC CANCER CENTER
BASOS ABS: 0.1 x10 3/mm (ref 0.0–0.1)
Basophil %: 1.2 %
EOS ABS: 0.1 x10 3/mm (ref 0.0–0.7)
EOS PCT: 0.8 %
HCT: 30.6 % — ABNORMAL LOW (ref 35.0–47.0)
HGB: 9.7 g/dL — AB (ref 12.0–16.0)
Lymphocyte #: 0.9 x10 3/mm — ABNORMAL LOW (ref 1.0–3.6)
Lymphocyte %: 12.9 %
MCH: 27.6 pg (ref 26.0–34.0)
MCHC: 31.6 g/dL — ABNORMAL LOW (ref 32.0–36.0)
MCV: 87 fL (ref 80–100)
MONOS PCT: 3.5 %
Monocyte #: 0.2 x10 3/mm (ref 0.2–0.9)
Neutrophil #: 5.6 x10 3/mm (ref 1.4–6.5)
Neutrophil %: 81.6 %
Platelet: 302 x10 3/mm (ref 150–440)
RBC: 3.51 10*6/uL — AB (ref 3.80–5.20)
RDW: 15.4 % — ABNORMAL HIGH (ref 11.5–14.5)
WBC: 6.9 x10 3/mm (ref 3.6–11.0)

## 2014-07-06 LAB — CBC CANCER CENTER
BASOS ABS: 0.1 x10 3/mm (ref 0.0–0.1)
BASOS PCT: 2.1 %
EOS PCT: 0.6 %
Eosinophil #: 0 x10 3/mm (ref 0.0–0.7)
HCT: 34.6 % — AB (ref 35.0–47.0)
HGB: 10.8 g/dL — ABNORMAL LOW (ref 12.0–16.0)
LYMPHS PCT: 17.1 %
Lymphocyte #: 1 x10 3/mm (ref 1.0–3.6)
MCH: 27.5 pg (ref 26.0–34.0)
MCHC: 31.1 g/dL — ABNORMAL LOW (ref 32.0–36.0)
MCV: 89 fL (ref 80–100)
Monocyte #: 0.7 x10 3/mm (ref 0.2–0.9)
Monocyte %: 12.6 %
Neutrophil #: 3.9 x10 3/mm (ref 1.4–6.5)
Neutrophil %: 67.6 %
Platelet: 315 x10 3/mm (ref 150–440)
RBC: 3.91 10*6/uL (ref 3.80–5.20)
RDW: 16.7 % — ABNORMAL HIGH (ref 11.5–14.5)
WBC: 5.7 x10 3/mm (ref 3.6–11.0)

## 2014-07-06 LAB — CALCIUM: Calcium, Total: 10.1 mg/dL (ref 8.5–10.1)

## 2014-07-06 LAB — CREATININE, SERUM
Creatinine: 1.03 mg/dL (ref 0.60–1.30)
EGFR (Non-African Amer.): 55 — ABNORMAL LOW

## 2014-07-06 LAB — HEPATIC FUNCTION PANEL A (ARMC)
Albumin: 3.5 g/dL (ref 3.4–5.0)
Alkaline Phosphatase: 63 U/L
Bilirubin, Direct: 0.1 mg/dL (ref 0.0–0.2)
Bilirubin,Total: 0.3 mg/dL (ref 0.2–1.0)
SGOT(AST): 23 U/L (ref 15–37)
SGPT (ALT): 20 U/L
TOTAL PROTEIN: 7.4 g/dL (ref 6.4–8.2)

## 2014-07-19 ENCOUNTER — Ambulatory Visit: Payer: Self-pay | Admitting: Internal Medicine

## 2014-08-07 ENCOUNTER — Emergency Department: Payer: Self-pay | Admitting: Emergency Medicine

## 2014-08-07 LAB — CBC
HCT: 34.1 % — AB (ref 35.0–47.0)
HGB: 10.6 g/dL — ABNORMAL LOW (ref 12.0–16.0)
MCH: 27.6 pg (ref 26.0–34.0)
MCHC: 31.1 g/dL — AB (ref 32.0–36.0)
MCV: 89 fL (ref 80–100)
PLATELETS: 250 10*3/uL (ref 150–440)
RBC: 3.85 10*6/uL (ref 3.80–5.20)
RDW: 15.8 % — ABNORMAL HIGH (ref 11.5–14.5)
WBC: 11 10*3/uL (ref 3.6–11.0)

## 2014-08-07 LAB — COMPREHENSIVE METABOLIC PANEL
ALK PHOS: 65 U/L
AST: 30 U/L (ref 15–37)
Albumin: 3.2 g/dL — ABNORMAL LOW (ref 3.4–5.0)
Anion Gap: 6 — ABNORMAL LOW (ref 7–16)
BUN: 11 mg/dL (ref 7–18)
Bilirubin,Total: 0.3 mg/dL (ref 0.2–1.0)
CO2: 31 mmol/L (ref 21–32)
CREATININE: 0.77 mg/dL (ref 0.60–1.30)
Calcium, Total: 9.2 mg/dL (ref 8.5–10.1)
Chloride: 102 mmol/L (ref 98–107)
GLUCOSE: 228 mg/dL — AB (ref 65–99)
Osmolality: 284 (ref 275–301)
Potassium: 3.6 mmol/L (ref 3.5–5.1)
SGPT (ALT): 16 U/L
Sodium: 139 mmol/L (ref 136–145)
Total Protein: 7.3 g/dL (ref 6.4–8.2)

## 2014-08-07 LAB — URINALYSIS, COMPLETE
BLOOD: NEGATIVE
Bacteria: NONE SEEN
Bilirubin,UR: NEGATIVE
Glucose,UR: 50 mg/dL (ref 0–75)
KETONE: NEGATIVE
Nitrite: NEGATIVE
Ph: 8 (ref 4.5–8.0)
RBC, UR: NONE SEEN /HPF (ref 0–5)
Specific Gravity: 1.028 (ref 1.003–1.030)
Squamous Epithelial: NONE SEEN
WBC UR: NONE SEEN /HPF (ref 0–5)

## 2014-08-15 LAB — CBC CANCER CENTER
BASOS ABS: 0.1 x10 3/mm (ref 0.0–0.1)
BASOS PCT: 1.2 %
Eosinophil #: 0.2 x10 3/mm (ref 0.0–0.7)
Eosinophil %: 3.1 %
HCT: 34.3 % — ABNORMAL LOW (ref 35.0–47.0)
HGB: 10.8 g/dL — ABNORMAL LOW (ref 12.0–16.0)
LYMPHS ABS: 0.8 x10 3/mm — AB (ref 1.0–3.6)
Lymphocyte %: 13.8 %
MCH: 27.4 pg (ref 26.0–34.0)
MCHC: 31.5 g/dL — AB (ref 32.0–36.0)
MCV: 87 fL (ref 80–100)
Monocyte #: 0.5 x10 3/mm (ref 0.2–0.9)
Monocyte %: 9.3 %
NEUTROS ABS: 4.2 x10 3/mm (ref 1.4–6.5)
NEUTROS PCT: 72.6 %
Platelet: 303 x10 3/mm (ref 150–440)
RBC: 3.94 10*6/uL (ref 3.80–5.20)
RDW: 15.5 % — ABNORMAL HIGH (ref 11.5–14.5)
WBC: 5.8 x10 3/mm (ref 3.6–11.0)

## 2014-08-15 LAB — CREATININE, SERUM: Creatinine: 0.73 mg/dL (ref 0.60–1.30)

## 2014-08-19 ENCOUNTER — Ambulatory Visit: Payer: Self-pay | Admitting: Internal Medicine

## 2014-08-27 ENCOUNTER — Inpatient Hospital Stay: Payer: Self-pay | Admitting: Internal Medicine

## 2014-08-27 LAB — URINALYSIS, COMPLETE
BILIRUBIN, UR: NEGATIVE
Glucose,UR: >=500 mg/dL (ref 0–75)
Nitrite: POSITIVE
PH: 5 (ref 4.5–8.0)
Specific Gravity: 1.018 (ref 1.003–1.030)
Squamous Epithelial: NONE SEEN
WBC UR: 939 /HPF (ref 0–5)

## 2014-08-27 LAB — BASIC METABOLIC PANEL
Anion Gap: 13 (ref 7–16)
BUN: 12 mg/dL (ref 7–18)
CALCIUM: 8.3 mg/dL — AB (ref 8.5–10.1)
CHLORIDE: 103 mmol/L (ref 98–107)
CO2: 23 mmol/L (ref 21–32)
Creatinine: 1.61 mg/dL — ABNORMAL HIGH (ref 0.60–1.30)
EGFR (African American): 40 — ABNORMAL LOW
EGFR (Non-African Amer.): 33 — ABNORMAL LOW
Glucose: 379 mg/dL — ABNORMAL HIGH (ref 65–99)
Osmolality: 293 (ref 275–301)
Potassium: 4 mmol/L (ref 3.5–5.1)
SODIUM: 139 mmol/L (ref 136–145)

## 2014-08-27 LAB — COMPREHENSIVE METABOLIC PANEL
ALBUMIN: 3.2 g/dL — AB (ref 3.4–5.0)
ALK PHOS: 78 U/L
Anion Gap: 12 (ref 7–16)
BUN: 11 mg/dL (ref 7–18)
Bilirubin,Total: 0.6 mg/dL (ref 0.2–1.0)
Calcium, Total: 9 mg/dL (ref 8.5–10.1)
Chloride: 101 mmol/L (ref 98–107)
Co2: 25 mmol/L (ref 21–32)
Creatinine: 1.56 mg/dL — ABNORMAL HIGH (ref 0.60–1.30)
EGFR (African American): 41 — ABNORMAL LOW
GFR CALC NON AF AMER: 34 — AB
GLUCOSE: 380 mg/dL — AB (ref 65–99)
Osmolality: 291 (ref 275–301)
Potassium: 3.6 mmol/L (ref 3.5–5.1)
SGOT(AST): 16 U/L (ref 15–37)
SGPT (ALT): 14 U/L
Sodium: 138 mmol/L (ref 136–145)
Total Protein: 8.1 g/dL (ref 6.4–8.2)

## 2014-08-27 LAB — CBC WITH DIFFERENTIAL/PLATELET
BASOS PCT: 0.2 %
Basophil #: 0 10*3/uL (ref 0.0–0.1)
EOS PCT: 0 %
Eosinophil #: 0 10*3/uL (ref 0.0–0.7)
HCT: 37.6 % (ref 35.0–47.0)
HGB: 11.8 g/dL — AB (ref 12.0–16.0)
LYMPHS PCT: 2 %
Lymphocyte #: 0.2 10*3/uL — ABNORMAL LOW (ref 1.0–3.6)
MCH: 27.5 pg (ref 26.0–34.0)
MCHC: 31.2 g/dL — ABNORMAL LOW (ref 32.0–36.0)
MCV: 88 fL (ref 80–100)
MONOS PCT: 8 %
Monocyte #: 0.9 x10 3/mm (ref 0.2–0.9)
NEUTROS ABS: 9.8 10*3/uL — AB (ref 1.4–6.5)
Neutrophil %: 89.8 %
Platelet: 282 10*3/uL (ref 150–440)
RBC: 4.27 10*6/uL (ref 3.80–5.20)
RDW: 15.1 % — ABNORMAL HIGH (ref 11.5–14.5)
WBC: 11 10*3/uL (ref 3.6–11.0)

## 2014-08-27 LAB — MAGNESIUM: MAGNESIUM: 1.2 mg/dL — AB

## 2014-08-27 LAB — TROPONIN I

## 2014-08-28 LAB — BASIC METABOLIC PANEL
Anion Gap: 7 (ref 7–16)
BUN: 19 mg/dL — AB (ref 7–18)
CHLORIDE: 108 mmol/L — AB (ref 98–107)
Calcium, Total: 7.8 mg/dL — ABNORMAL LOW (ref 8.5–10.1)
Co2: 26 mmol/L (ref 21–32)
Creatinine: 1.52 mg/dL — ABNORMAL HIGH (ref 0.60–1.30)
EGFR (African American): 43 — ABNORMAL LOW
EGFR (Non-African Amer.): 35 — ABNORMAL LOW
Glucose: 246 mg/dL — ABNORMAL HIGH (ref 65–99)
Osmolality: 292 (ref 275–301)
POTASSIUM: 3.7 mmol/L (ref 3.5–5.1)
Sodium: 141 mmol/L (ref 136–145)

## 2014-08-28 LAB — CBC WITH DIFFERENTIAL/PLATELET
Basophil #: 0 10*3/uL (ref 0.0–0.1)
Basophil %: 0.1 %
EOS PCT: 0 %
Eosinophil #: 0 10*3/uL (ref 0.0–0.7)
HCT: 30.4 % — ABNORMAL LOW (ref 35.0–47.0)
HGB: 9.5 g/dL — ABNORMAL LOW (ref 12.0–16.0)
Lymphocyte #: 0.6 10*3/uL — ABNORMAL LOW (ref 1.0–3.6)
Lymphocyte %: 3.9 %
MCH: 27.4 pg (ref 26.0–34.0)
MCHC: 31.2 g/dL — ABNORMAL LOW (ref 32.0–36.0)
MCV: 88 fL (ref 80–100)
MONOS PCT: 10.5 %
Monocyte #: 1.6 x10 3/mm — ABNORMAL HIGH (ref 0.2–0.9)
NEUTROS PCT: 85.5 %
Neutrophil #: 12.8 10*3/uL — ABNORMAL HIGH (ref 1.4–6.5)
PLATELETS: 142 10*3/uL — AB (ref 150–440)
RBC: 3.46 10*6/uL — AB (ref 3.80–5.20)
RDW: 15 % — ABNORMAL HIGH (ref 11.5–14.5)
WBC: 14.9 10*3/uL — AB (ref 3.6–11.0)

## 2014-08-28 LAB — URINE CULTURE

## 2014-08-30 LAB — CULTURE, BLOOD (SINGLE)

## 2014-08-31 LAB — CULTURE, BLOOD (SINGLE)

## 2014-09-01 LAB — CBC WITH DIFFERENTIAL/PLATELET
Comment - H1-Com2: NORMAL
EOS PCT: 1 %
HCT: 28.3 % — ABNORMAL LOW (ref 35.0–47.0)
HGB: 9 g/dL — AB (ref 12.0–16.0)
Lymphocytes: 6 %
MCH: 26.9 pg (ref 26.0–34.0)
MCHC: 31.8 g/dL — ABNORMAL LOW (ref 32.0–36.0)
MCV: 84 fL (ref 80–100)
MONOS PCT: 15 %
Platelet: 109 10*3/uL — ABNORMAL LOW (ref 150–440)
RBC: 3.36 10*6/uL — AB (ref 3.80–5.20)
RDW: 15.9 % — ABNORMAL HIGH (ref 11.5–14.5)
Segmented Neutrophils: 78 %
WBC: 14.9 10*3/uL — AB (ref 3.6–11.0)

## 2014-09-01 LAB — BASIC METABOLIC PANEL
Anion Gap: 8 (ref 7–16)
BUN: 14 mg/dL (ref 7–18)
CALCIUM: 7.1 mg/dL — AB (ref 8.5–10.1)
CO2: 23 mmol/L (ref 21–32)
CREATININE: 1.22 mg/dL (ref 0.60–1.30)
Chloride: 107 mmol/L (ref 98–107)
EGFR (Non-African Amer.): 45 — ABNORMAL LOW
GFR CALC AF AMER: 55 — AB
Glucose: 137 mg/dL — ABNORMAL HIGH (ref 65–99)
Osmolality: 278 (ref 275–301)
POTASSIUM: 3.1 mmol/L — AB (ref 3.5–5.1)
Sodium: 138 mmol/L (ref 136–145)

## 2014-09-01 LAB — MAGNESIUM: Magnesium: 1.6 mg/dL — ABNORMAL LOW

## 2014-09-04 LAB — CULTURE, BLOOD (SINGLE)

## 2014-09-05 LAB — CULTURE, BLOOD (SINGLE)

## 2014-10-21 ENCOUNTER — Ambulatory Visit: Payer: Self-pay | Admitting: Family

## 2014-10-24 ENCOUNTER — Ambulatory Visit
Admit: 2014-10-24 | Disposition: A | Discharge status: Home / Self Care | Payer: Self-pay | Attending: Internal Medicine | Admitting: Internal Medicine

## 2014-11-18 ENCOUNTER — Ambulatory Visit
Admit: 2014-11-18 | Disposition: A | Discharge status: Home / Self Care | Payer: Self-pay | Attending: Internal Medicine | Admitting: Internal Medicine

## 2014-11-20 ENCOUNTER — Other Ambulatory Visit: Admit: 2014-11-20 | Disposition: A | Discharge status: Home / Self Care | Payer: Self-pay

## 2014-11-20 LAB — PROTIME-INR
INR: 1.7
Prothrombin Time: 20 secs — ABNORMAL HIGH

## 2014-12-04 ENCOUNTER — Other Ambulatory Visit
Admit: 2014-12-04 | Disposition: A | Discharge status: Home / Self Care | Payer: Self-pay | Attending: Family | Admitting: Family

## 2014-12-04 LAB — URINALYSIS, COMPLETE
BACTERIA: NONE SEEN
Bilirubin,UR: NEGATIVE
GLUCOSE, UR: NEGATIVE mg/dL (ref 0–75)
NITRITE: NEGATIVE
Ph: 6 (ref 4.5–8.0)
Protein: 100
Specific Gravity: 1.011 (ref 1.003–1.030)
Squamous Epithelial: NONE SEEN

## 2014-12-04 LAB — BASIC METABOLIC PANEL
Anion Gap: 16 (ref 7–16)
BUN: 7 mg/dL
CALCIUM: 8.4 mg/dL — AB
CO2: 23 mmol/L
CREATININE: 0.66 mg/dL
Chloride: 104 mmol/L
EGFR (African American): >60
EGFR (Non-African Amer.): >60
Glucose: 213 mg/dL — ABNORMAL HIGH
POTASSIUM: 2.4 mmol/L — AB
Sodium: 143 mmol/L

## 2014-12-06 LAB — URINE CULTURE

## 2014-12-09 NOTE — Consult Note (Signed)
Reason for Visit: This 81 year old Female patient presents to the clinic for initial evaluation of  metastatic breast cancer who .   Referred by Dr. Ma Hillock.  Diagnosis:  Chief Complaint/Diagnosis   81 year old female status post right modified radical mastectomy for a T2 N1 (MI) infiltrating ductal carcinoma the right breast in 2006. As hypermetabolic mass in the upper chest in the region internal mammary nodes progressed and starting to cause early superior vena cava involvement. Now seen for radiation therapy to her chest to prevent further hemodynamic compromise.  Pathology Report pathology reports requested for my review   Imaging Report CT scan and PET CT scan was reviewed   Referral Report clinical notes reviewed   Planned Treatment Regimen IMRT radiation therapy to her chest   HPI   patient is a pleasant 81 year old female whose history dates back to 2006 which underwent a right modified radical mastectomy for a T2 N1 (MI) M0 infiltrating ductal carcinoma ER/PR positive. Original tumor was 2.2 x 1.9 cm. She was on Femara although was discontinued in 2010 after his CVA. One of 4 axillary nodes was positive for micrometastatic involvement. She had a repeat PET/CT scandone in March of 2013 showing increased hypermetabolic activity in the retrosternal area consistent with metastatic disease. She actually her core biopsy performed on February 2014 confirming metastatic carcinoma consistent with breast primary ER/PR positive. She has been on exemestane although repeat CT scan performed July 06, 2013 shows progressive disease now with probable involvement of superior vena cava. She showing no evidence of superior vena cava syndrome with no facial plethora orvenous jugular distention. She is wheelchair-bound based on her prior CVA. I have been asked by Dr. Ma Hillock who I with the patient for possibility of radiation therapy to this area of hypermetabolic activity. As an incidental note she has a  pole or lesion in her kidney which is being followed.  Past Hx:    diabetes type II:    degenerative joint dz:    hyperlipidemia:    mild aortic stenosis/mild tricuspid & mitral regurg:    cateracts:    CVA/Stroke:    Cancer, Breast:   Past, Family and Social History:  Past Medical History positive   Cardiovascular hyperlipidemia; aortic stenosis   Genitourinary kidney stones   Endocrine diabetes mellitus   Neurological/Psychiatric CVA   Past Surgical History right modified radical mastectomy   Past Medical History Comments cataracts, degenerative joint disease, history of shingles   Family History positive   Family History Comments family history of breast cancer   Social History noncontributory   Additional Past Medical and Surgical History accompanied by multiple members of her group home   Allergies:   No Known Allergies:   Home Meds:  Home Medications: Medication Instructions Status  exemestane 25 mg oral tablet 1 tab(s) orally once a day Active  Protonix enteric coated tablet 40 mg 1 tab(s) orally once a day x 30 days  Active  Plavix 75 mg oral tablet 1 tab(s) po once a day  Active  Toprol-XL 25 mg oral tablet, extended release 1 tab(s) po once a day  Active  potassium chloride 10 mEq oral capsule, extended release 1   once a day  Active  Imodium A-D  Active  Drisdol capsule 50,000 intl units 1 cap(s)  once a month, take on 1st Monday of each month Active  lisinopril-hydrochlorothiazide 20/12.5 1 tab(s) orally once a day Active  Glucotrol XL 10 mg oral tablet, extended release 1 tab(s) orally  once a day Active  Lipitor 40 mg oral tablet 1 tab(s) orally once a day (at bedtime) Active  Robitussin DM 1  orally every 4 hours, As Needed Active  Lantus Solostar Pen 100 units/mL subcutaneous solution 28 unit(s) subcutaneous once a day Active  Norco 5 mg-325 mg oral tablet 1 tab(s) orally once a day (at bedtime), As Needed- for Pain  Active  ferrous sulfate  325 mg (65 mg elemental iron) oral tablet 2 tab(s) orally once a day Active  Mobic 7.5 mg oral tablet 1 tab(s) orally once a day Active  Desitin 13% topical cream Apply topically to affected area 6 times a day, As Needed Active   Review of Systems:  Performance Status (ECOG) 1   Skin negative   Breast see HPI   Ophthalmologic negative   ENMT negative   Respiratory and Thorax negative   Cardiovascular negative   Gastrointestinal negative   Genitourinary negative   Musculoskeletal negative   Neurological see HPI   Psychiatric negative   Hematology/Lymphatics negative   Endocrine negative   Allergic/Immunologic negative   Review of Systems   review of systems obtained from nurse's notes  Nursing Notes:  Nursing Vital Signs and Chemo Nursing Nursing Notes: *CC Vital Signs Flowsheet:   19-Nov-14 14:59  Temp Temperature 98.8  Pulse Pulse 83  Respirations Respirations 20  SBP SBP 137  DBP DBP 81  Pain Scale (0-10)  0   Physical Exam:  General/Skin/HEENT:  Skin normal   Eyes normal   ENMT normal   Head and Neck normal   Additional PE well-developed wheelchair-bound female in NAD. Patient has left hemiparesis from prior CVA. She status post right modified radical mastectomy chest walls clear without evidence of recurrent mass or nodularity. Left breast is free of dominant mass into position examined. Lungs are clear to A&P cardiac examination shows regular rate and rhythm. No evidence of venous jugular distention or facial plethora is noted. Abdomen is benign.   Breasts/Resp/CV/GI/GU:  Respiratory and Thorax normal   Cardiovascular normal   Gastrointestinal normal   Genitourinary normal   MS/Neuro/Psych/Lymph:  Musculoskeletal normal   Neurological normal   Lymphatics normal   Other Results:  Radiology Results: LabUnknown:    05-Jun-14 11:47, CT Chest With Contrast  PACS Image     18-Nov-14 12:47, CT Chest With Contrast  PACS Image   CT:     05-Jun-14 11:47, CT Chest With Contrast  CT Chest With Contrast   REASON FOR EXAM:    restaging metastatic breast CA  COMMENTS:       PROCEDURE: KCT - KCT CHEST WITH CONTRAST  - Jan 21 2013 11:47AM     RESULT: Comparison is made to prior study dated 03/26/2012.    Technique: Helical 3 mm sections were obtained from the thoracic inlet   through the lung bases status post intravenous administration of 75 mL of   Isovue-370.    Findings: The thyroid is enlarged and demonstrates a mild heterogeneous   architecture. Vague nodules are identified within the peripheral aspect   of the right lobe and hypoechoic nodule in the anterior aspect of the   right lobe. Coarse calcifications are identified within the right lobe of     the thyroid.     A 1.23 x 0.96 centimeter nodule is once again appreciated just anterior   tothe right brachiocephalic artery image #46 of the soft tissue series.   This finding appears stable and likely represents a lymph node.  A 2.2 x 2.61 cm anterior mediastinal mass is again appreciated. This mass   was measured on image #31 of the soft tissue series. This appears less   prominent when compared to the previous study.     A 2.32 x 2.21 cm area of soft tissue density projects just anterior to   the distal aspect of the brachiocephalic vein on the right. This is   measured on image #26 of the soft tissue windows.    Coarse calcifications are identified within the left breast.  The lung parenchyma demonstrates hypoventilation within the lung bases. A   4.6 mm nodule projects along the peripheral lateral apex of the major   fissureon the right measured on image #35 of the lung windows.    A small amorphous area of hyperattenuation projects along the anterior   dome of the liver image #53 of the soft tissue windows likely   representing a hemangioma. There is diffuse low-attenuation within the   architecture of the liver consistent with fatty infiltration. A  lobulated   cystic appearing focus projects in the region of the body and tail of the   pancreas only partially visualized on the present study and grossly   measuring 3.15 x 1.64 cm. This finding has been described on previous   studies and appears stable. The remaining visualized upper abdominal   viscera are unremarkable.    IMPRESSION:    1. Persistent anterior mediastinal nodule slightly less prominent when   compared to the previous study. The amorphous soft tissue density   anterior to the brachiocephalic vein appears slightly more prominent.  2. Stable lymph node just anterior to the right brachiocephalic artery.  3. Indeterminate nodule along the major fissure on the right.  4. Likely hemangioma within the liver.  5. Hepatic steatosis.  6. Stable lobulated cystic focus involving the pancreas. This may   represent a pseudocyst.    Thank you for the opportunity to contribute to the care of your patient.         Verified By: Mikki Santee, M.D., MD    [phone] 12:47, CT Chest With Contrast  CT Chest With Contrast   REASON FOR EXAM:    Restaging metastatic breast CA On hormonal therapy  COMMENTS:       PROCEDURE: CT  - CT CHEST WITH CONTRAST  - Jul 06 2013 12:47PM     CLINICAL DATA:  History breast cancer.  Restaging scan.    EXAM:  CT CHEST WITH CONTRAST    TECHNIQUE:  Multidetector CT imaging of the chest was performed during  intravenous contrast administration.    CONTRAST:  75 mm of Isovue 370.  COMPARISON:  Chest CT 03/26/2012.    FINDINGS:  Mediastinum: Heart size is borderline enlarged. There is no  significant pericardial fluid, thickening or pericardial  calcification. There is atherosclerosis of the thoracic aorta, the  great vessels of the mediastinum and the coronary arteries,  including calcified atherosclerotic plaque in the left main, left  anterior descending and left circumflex coronary arteries. Compared  to the prior study, one of the  previously described anterior  mediastinal soft tissue lesions have increased in size, most  compatible with metastatic lymphadenopathy. These include an  irregular-shaped nodal mass measuring 2.1 x 3.2 cm (image 17 of  series 2) in the anterior mediastinum immediately inferior to the  left innominate vein, which is similar to the prior study. In  addition, there is a large 4.7 x 2.4 cm  mass (image 14 of series 2)  which completely encases the distal aspect of the right internal  jugular vein near the confluence with the left innominate vein,  significantly increased compared to the prior study. The right  internal jugular vein is nearly completely occluded (best  demonstrated on image 14 of series 2). There is also a 8 x 20 mm  right internal mammary lymph node (image 33 of series 2).  Calcifications of the aortic valve.    Lungs/Pleura: 7 mm subpleural nodule along the lateral aspect ofthe  right major fissure (image 22 of series 3). No other larger more  suspicious appearing pulmonary nodules or masses are otherwise  identified on today's examination. Study is limited by a large  amount of patient respiratory motion. No acute consolidative  airspace disease. No pleural effusions.  Upper Abdomen: 3.1 x 1.6 cm low-attenuation lesion in the anterior  aspect of the mid body of the pancreas, similar in retrospect  compared to prior examinations dating back to at least 07/14/2012,  favored to represent a pancreatic pseudocyst.    Musculoskeletal: Status post right modified radical mastectomy and  axillary nodal dissection. No definite soft tissue mass along the  right chest wall and no right axillary lymphadenopathy identified at  this time. Multiple coarse calcifications are noted within the left  breast. No left axillary lymphadenopathy. Irregular sclerosis in the  sternum just inferior to the sternomanubrial joint, increased  compared to the prior examination, potentiallya site of  metastatic  disease.     IMPRESSION:  1. Interval increase in one of the previously noted mediastinal soft  tissue masses, which currently measures 4.7 x 2.4 cm and appears  highly aggressive nearly completely occluding the right internal  jugular vein at the confluence with the left innominate vein. In  addition, there is probable right internal mammary lymphadenopathy  which is new compared to the prior study. One of the other  previously noted soft tissue masses in the anterior mediastinum is  similar to the prior study. Whether or not this truly represents  metastatic disease, or is related to a 2nd anterior mediastinal  primary lesion such as a thymoma is uncertain. Based on today's  findings, this is favored to represent metastatic disease, but  correlation with biopsy is recommended.  2. Increasing sclerosis in the sternum just inferior to the  sternomanubrial joint, concerning for potential bony metastasis.  3. New 7 mm subpleural nodule along the superior aspect of the right  major fissure. This is nonspecific. This may simply represent a  subpleural lymph node, but the possibility of a solitary pulmonary  metastasis is not entirely excluded. Attention on followup studies  is recommended.  4. Atherosclerosis, including left main and 3 vessel coronary artery  disease. Assessment for potential risk factor modification, dietary  therapy or pharmacologic therapy may be warranted, if clinically  indicated.  5. Additional incidental findings, as above, similar prior studies.      Electronically Signed    By: Vinnie Langton M.D.    On: 07/06/2013 15:15     Verified By: Etheleen Mayhew, M.D.,  Nuclear Med:    27-Mar-13 14:47, PET/CT Scan Breast CA Stage/Restaging  PET/CT Scan Breast CA Stage/Restaging   REASON FOR EXAM:      COMMENTS:       PROCEDURE: PET - PET/CT RESTG BREAST CA  - Nov 13 2011  2:47PM     RESULT:  The patient is undergoing restaging of breast  malignancy. The  patient's fasting blood glucose level was 123 mg/dL. The patient received   12.4 mCi of F-18 labeled FDG at 13:10 hours with scanning beginning at   14:22 hours. A noncontrast CT scan was performed at the same sitting for   coregistration and attenuation correction. Reference is made to the CT   scan of the chest, abdomen, and pelvis dated October 31, 2011.    There is diffusely increased but fairly low level uptake in the thyroid   gland. Along the lateral aspect of the right thyroid lobe there is a   focal area of increased activity with SUV of 1.9 with a mean of 1.5.This     is of the same density as the adjacent enlarged thyroid tissue and cannot   be distinguished from it. There is a focal area of increased uptake just   inferior to the right thyroid lobe. This exhibits an SUV of 3.4 with a   mean of 2.1. Within an area of abnormal retrosternal soft tissue density   previously demonstrated there is abnormal uptake with SUV of 1.7 with a   mean of 1.2 within the anterior chest wall I do not see abnormal uptake.    The patient is undergone previous right mastectomy. No axillary   abnormality is seen. Within the abdomen I see no abnormal uptake within   the liver or adrenal glands. Normal expected urinary tract and bowel   uptake is demonstrated. There is focally increased uptake however in the   posterior aspect of the midpole of the right kidney in the area of the   known mass on previous CT scan. This exhibits an SUV of 2 with a mean of   1.9. There is no abnormal skeletal uptake.    IMPRESSION:    1. There is diffusely increased uptake within the enlarged thyroid gland.   Associated with the right lateral aspect of the thyroid gland there is a   subtle area of nodularity the maximal SUV of 1.9 which cannot be   distinguished from the other thyroid tissue on the CT images and may   reflect hypermetabolic nodule.  2. Below the right thyroid gland there are 2 foci  of increased uptake.   One lies in the retrosternal soft tissues. This corresponds to an area of   soft tissue density seen on the CT imaging earlier. These findings may   reflect metastatic disease.  3. There is abnormal uptake in the posterior aspect of the midpole the   right kidney where there is a known indeterminate mass. This could   reflect either primary renal malignancy or metastatic disease.      Addendum: Due to software problem which was corrected on 18 Dec 2011, the   areas of increased uptake described above were found to have been   assigned a erroneously low SUV values. The corrected SUV values are as   follows:     In the right aspect of the thyroid gland a subtle area of increased   uptake exhibits a maximal SUV of 7.7 with a mean of 5.4.    Inferior to the thyroid gland two areas of increased uptake are noted.   One to the right of midline exhibits a maximal SUV of 14.6 with a mean of   9.1. The lesionto the left of midline exhibits maximal SUV of 7 with a   mean of 4.5.    The focus of increased uptake in the posterior aspect of the right kidney  exhibits a maximal SUV of 8.3 with a mean of 4.7.    Verified By: DAVID A. 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 Plan:   I discussed the case personally with Dr. Ma Hillock. Burtis Junes going ahead with IMRT radiation therapy to her superior mediastinal mass up to 6000 cGy over 6 weeks. Would use IMRT to spare critical structures such are as her esophagus, spinal cordheart and normal lung volume. Risks and benefits of treatment including dysphasia from radiation esophagitis, destruction of some normal lung volume, skin reaction, fatigue, and possible  alteration of blood counts all were explained in detail to the patient. She seems to comprehend my treatment plan well. I have set her up for CT simulation later this week.  I would like to take this opportunity to thank you for allowing me to continue to participate in this patient's care.  CC Referral:  cc: Dr. Mina Marble, Dr. Hervey Ard   Electronic Signatures: Baruch Gouty, Roda Shutters (MD)  (Signed [phone] 16:11)  Authored: HPI, Diagnosis, Past Hx, PFSH, Allergies, Home Meds, ROS, Nursing Notes, Physical Exam, Other Results, Relevent Results, Encounter Assessment and Plan, CC Referring Physician   Last Updated: 19-Nov-14 16:11 by Armstead Peaks (MD)

## 2014-12-18 NOTE — Discharge Summary (Signed)
PATIENT NAME:  Brittany Camacho, Brittany Camacho MR#:  [id number] DATE OF BIRTH:  06-Jul-1934  DATE OF ADMISSION:  08/27/2014 DATE OF DISCHARGE:  09/01/2014  DISCHARGE DIAGNOSES:  1.  Klebsiella and Proteus sepsis secondary to urinary tract infection.  2.  Urinary tract infection,  3.  Sinus tachycardia, due to her sepsis, now resolving.  4.  Generalized weakness. Recommended rehabilitation, but the patient is adamant in not wanting to go to rehabilitation and just go home.   SECONDARY DIAGNOSES:   1.  Breast cancer, infiltrating ductal carcinoma diagnosed in January 2008 status post mastectomy and chemotherapy. She follows with Dr. Ma Hillock regularly.  2.  Hypertension.  3.  Diabetes.  4.  History of cerebrovascular accident x 3 with some leftover aphasia and left-sided weakness, able to walk with a cane.  5.  Recurrent urinary tract infection in the past.  6.  Osteoporosis.    CONSULTATION: Physical therapy.   PROCEDURES AND RADIOLOGY: Chest x-ray on 08/27/2014 showed no edema or consolidation.  MAJOR LABORATORY PANEL: Urinalysis on admission showed 939 WBCs, 3+ leukocyte esterase,  positive nitrite, 2+ bacteria, and WBC in clumps.   Urine culture initially was contaminated. Blood culture grew Klebsiella pneumoniae along with Proteus mirabilis x 2.   Repeat blood culture x 2 were negative on 08/30/2014 and 08/31/2014.   HISTORY AND SHORT HOSPITAL COURSE: The patient is a 81 year old female with the above-mentioned medical problems who was admitted for urinary tract infection leading to sepsis. Please see Dr. Nichola Sizer dictated history and physical for further details. The patient was started on IV antibiotics and was slowly improving. Physical therapy consultation was obtained, who recommended rehab, but the patient refused. The patient was slowly improving and was feeling back to her baseline by 09/01/2014, and was discharged home as the patient refused rehab. Her son was also in agreement with the same  that the patient would not need rehabilitation and he would take care of her at home.   VITAL SIGNS: On the date of discharge, her vital signs were as follows: Temperature 98.9, heart rate 88 per minute, respirations 18 per minute, blood pressure 132/78. She was saturating 96% on room air.   PERTINENT PHYSICAL EXAMINATION ON THE DATE OF DISCHARGE:  CARDIOVASCULAR: S1, S2 normal. No murmurs, rubs, or gallop.  LUNGS: Clear to auscultation bilaterally. No wheezing, rales, rhonchi, crepitation.  ABDOMEN: Soft, benign.  NEUROLOGIC: Nonfocal examination.  All other physical examination remained at baseline.   DISCHARGE MEDICATIONS:   Medication Instructions  hydrochlorothiazide-lisinopril 12.5 mg-20 mg oral tablet  1 tab(s) orally once a day for blood pressure.   potassium chloride 10 meq oral capsule, extended release  1 cap(s) orally once a day   metoprolol extended release 25 mg oral tablet, extended release  1 tab(s) orally once a day   meloxicam 15 mg oral tablet  1 tab(s) orally once a day   glipizide xl 10 mg oral tablet, extended release  1 tab(s) orally once a day   protonix 40 mg oral delayed release tablet  1 tab(s) orally once a day   atorvastatin 20 mg oral tablet  1 tab(s) orally once a day (at bedtime) for cholesterol.   ferrous sulfate 325 mg oral tablet  2 tabs (650mg ) orally once a day for iron replacement.   clopidogrel 75 mg oral tablet  1 tab(s) orally once a day   tylenol arthritis caplet 650 mg oral tablet, extended release  2 tabs (1300mg ) orally 2 times a day (morning and bedtime)  for pain.   dok plus 50 mg-8.6 mg oral tablet  2 tab(s) orally 2 times a day for constipation.   bisacodyl 10 mg rectal suppository  10 milligram(s) rectal once a day, As Needed - for Constipation   miralax - oral powder for reconstitution  17 milligram(s) orally once a day, As Needed - for Constipation   desitin 40% topical ointment  Apply topically to affected area prn   zofran odt 4 mg oral  tablet, disintegrating  4 milligram(s) orally every 6 hours, As Needed - for Nausea, Vomiting   ensure plus, 1 shake if skipping meal  1 suppository(ies) orally once   lantus solostar pen 100 units/ml subcutaneous solution  28 unit(s) subcutaneous once a day   imodium a-d 2 mg oral tablet  2 milligram(s) orally up to 6 times a day   calmoseptine ointment (obsolete)  Apply topically to affected area prn   drisdol 50,000 intl units oral capsule  50000 unit(s) orally once a month   oxygen  2   for O2 sats less than 90   bisacodyl 10 mg rectal suppository  10 milligram(s) rectal once a week   levofloxacin 750 mg oral tablet  1 tab(s) orally every 24 hours x 10 days      DISCHARGE DIET: Regular.   DISCHARGE ACTIVITY: As tolerated.   DISCHARGE INSTRUCTIONS AND FOLLOWUP: The patient was instructed to follow up with her primary care physician, Dr. Dorthea Cove with [facility] in 1-2 weeks.   TOTAL TIME DISCHARGING THIS PATIENT: 45 minutes.    ____________________________ Lucina Mellow. Manuella Ghazi, MD vss:MT D: 09/03/2014 00:13:23 ET T: 09/03/2014 08:56:25 ET JOB#: [id number]  cc: Damarien Nyman S. Manuella Ghazi, MD, <Dictator> Danelle Berry. Derrek Monaco, MD Lucina Mellow [facility] MD ELECTRONICALLY SIGNED 09/08/2014 10:11

## 2014-12-18 NOTE — H&P (Signed)
PATIENT NAME:  Brittany Camacho, Brittany Camacho MR#:  [id number] DATE OF BIRTH:  [birth year]  DATE OF ADMISSION:  08/27/2014  PRIMARY CARE PHYSICIAN: Dr. Antony Salmon    REFERRING EMERGENCY ROOM PHYSICIAN:  Francene Castle, MD   CHIEF COMPLAINT: Vomiting.   HISTORY OF PRESENT ILLNESS:  A 81 year old female who lives home with her son has a history of stroke, diabetes, hypertension, and has some aphasia left over from stroke, able to do her daily activities.  She started vomiting since early morning today, vomited 3 times, and so decided to come to the Emergency Room.  On further questioning, she said that here she noticed some increased frequency of urination for the last 2 nights, but denies any foul smelling or burning in the urine. She had some fever this morning and feeling very weak. Denies any diarrhea or abdominal pain.   In the Emergency Room, on initial work-up, she was noted to have positive urinalysis. She had tachycardia. Her blood pressure was stable. White cell count is also not elevated, but had some fever. She was started on IV fluid and given Rocephin IV after found having positive urinalysis, and given to hospitalist team for admission.   REVIEW OF SYSTEMS:  CONSTITUTIONAL: Positive for generalized weakness and fever. No weight loss or weight gain.  EYES: No blurring, double vision, discharge or redness.  EARS, NOSE, THROAT: No tinnitus, ear pain or hearing loss.  RESPIRATORY: No cough, wheezing, hemoptysis, or shortness of breath.  CARDIOVASCULAR: No chest pain, orthopnea, edema, arrhythmia, palpitations.  GASTROINTESTINAL: The patient has some vomiting, no diarrhea or abdominal pain.  GENITOURINARY:  No dysuria, hematuria, but has increased frequency.  ENDOCRINE: No heat or cold intolerance. No excessive sweating.  SKIN: No acne, rashes, or lesions.  MUSCULOSKELETAL: No pain or swelling in the joints.  NEUROLOGICAL: No numbness, weakness, tremor.  PSYCHIATRIC: No anxiety, insomnia, bipolar  disorder.   PAST MEDICAL HISTORY:  1.  Breast cancer, infiltrating ductal carcinoma diagnosed in January 2008 status post mastectomy and chemotherapy. She follows with Dr. Ma Hillock regularly.  2.  Hypertension.  3.  Diabetes.  4.  History of cerebrovascular accident x 3 with some leftover aphasia and left-sided weakness, able to walk with a cane.  5.  Recurrent urinary tract infection in the past.  6.  Osteoporosis.   PAST SURGICAL HISTORY:  1.  Mastectomy for breast cancer.  2.  Cholecystectomy.  3.  Hysterectomy.   SOCIAL HISTORY: Lives with her son, walks with a cane at home. Denies smoking, drinking, alcohol.   FAMILY HISTORY: Positive for breast cancer in her sister, hypertension in mother.   HOME MEDICATIONS:  1.  Vitamin D3 at 50,000 international units once a month.  2.  Tylenol arthritis capsules total 300 mg oral 2 times a day as needed for pain.  3.  Protonix 40 mg oral once a day.  4.  Potassium chloride 10 mEq once a day.  5.  Polyethylene glycol oral powder take 17 grams once a day as needed for constipation.  6.  Metoprolol 25 mg once a day.  7.  Meloxicam 15 mg oral once a day.  8.  Hydrochlorothiazide and lisinopril 12.5-20 mg once a day.  9.  Glipizide 10 mg oral once a day.  10. Ferrous sulfate 325 mg once a day.  11. Clopidigrel 75 mg oral once a day.  12. Atorvastatin 20 mg oral once a day.   PHYSICAL EXAMINATION:  VITAL SIGNS: In the ER, temperature 100.9 on presentation, pulse rate  ranging from 125 to 140, respirations 30, blood pressure 165/85, and pulse oximetry is 94% on room air.  GENERAL: The patient is fully alert and oriented to time, place, and person. She is cooperative with history taking and physical examination.  HEENT: Head and neck atraumatic. Conjunctivae pink. Oral mucosa moist.  NECK: Supple. Thyroid nontender. No JVD.  RESPIRATORY: Bilateral equal and clear air entry, slight tachypnea present. No wheezing or crepitation.  CARDIOVASCULAR:  S1, S2 present, regular tachycardia.  On cardiac monitor it appears to be sinus tachycardia. No murmur heard.  ABDOMEN: Soft. Mild tenderness generalized. No mass felt. Bowel sounds normal. No postauricular angle tenderness. Legs, no edema.  SKIN: No acne, rashes, or lesions.  MUSCULOSKELETAL: No tenderness or swelling in the joints.  NEUROLOGICAL: Power 5/5 in right upper limb and right lower limb.  Power is 3/5 in left upper limb, and 2/5 in left lower limb. Follows commands. No tremor or rigidity.  PSYCHIATRIC: Does not appear in any acute psychiatric illness, has some aphasia and altered speech, but able to understand and communicates properly.   IMPORTANT LABORATORY RESULTS: WBC count 11,000, hemoglobin 11.8, platelet count 282,000, MCV 88, glucose 380, creatinine 1.56, sodium 138, potassium is 3.6, chloride 101, and CO2 25.  Magnesium 1.2. Troponin less than 0.02.   Chest x-ray, portable is done: No edema or consolidation.  The area, apparently for metastatic disease seen on prior CAT scan, are not appreciable on single view radiographic examination. Urinalysis is grossly positive with turbid urine and 939 WBCs, 3+ leukocyte esterase and nitrite positive. Lactic acid is 5.3.   ASSESSMENT AND PLAN: A 81 year old female who has a history of breast cancer hypertension, diabetes and stroke in the past, came with vomiting and mild generalized abdominal pain with fever and tachycardia.  1.  Sepsis. This is evident by tachycardia, tachypnea and fever which is secondary to urinary tract infection.  We will give IV fluids and treat underlying cause.  2.  Urinary tract infection. Urinalysis is strongly positive.  Urine culture is collected. Blood cultures are collected.  We will give IV ceftriaxone for now and change it according to culture report.  3.  Sinus tachycardia, likely to his component due to fever and due to her sepsis and infection. Blood pressure is stable.  We will give IV fluid and continue  her metoprolol as she was taking at home and follow accordingly.  4.  Hypertension. Currently, as she presented with sepsis, though blood pressure is stable, we would like to hold hydrochlorothiazide and lisinopril, but will continue metoprolol for heart rate control. Blood pressure is stable.  5.  Diabetes. We will continue her glipizide as she was taking at home and will give her insulin sliding scale coverage for any additional requirement.  6.  History of stroke. We will continue statin and Plavix as she was taking at home before the admission. 7.  Generalized weakness. We will get physical therapy consult for further evaluation.  She also has a history of stroke and some left-sided weakness left over.   TOTAL TIME SPENT ON THIS ADMISSION: 40 minutes.   CODE STATUS: FULL CODE.     ____________________________ Ceasar Lund Anselm Jungling, MD vgv:DT D: 08/27/2014 12:34:24 ET T: 08/27/2014 13:55:10 ET JOB#: [id number]  cc: Ceasar Lund. Anselm Jungling, MD, <Dictator> Dr. Antony Salmon, PCP  Vaughan Basta MD ELECTRONICALLY SIGNED 08/27/2014 16:58

## 2015-01-05 IMAGING — CT CT CHEST W/O CM
2 of 3 series · 15 of 36 positions shown, 18 images · non-contrast
Comparison: 02/22/2014 chest CT

CLINICAL DATA: Breast cancer, metastatic mediastinal mass and
lymphadenopathy. Restaging.

EXAM:
CT CHEST WITHOUT CONTRAST
TECHNIQUE: Multidetector CT imaging of the chest was performed following the
standard protocol without IV contrast..

[Series 2: routine chest wo · axial · 0.69mm/px · z∈[-586,-376]mm · 12 of 50 slices shown, 15 images]
[im 4/50  mediastinal]
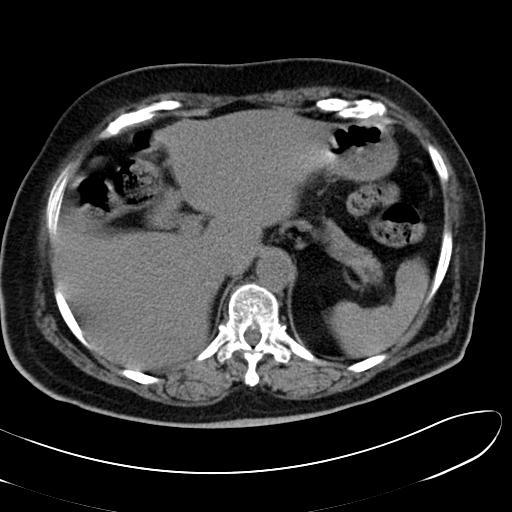
[im 4/50  lung]
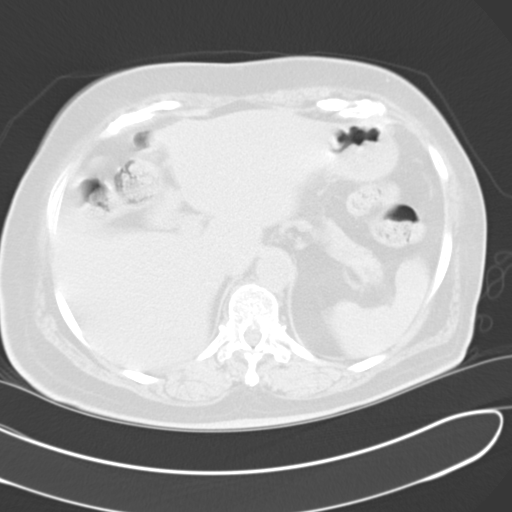
[im 8/50  lung]
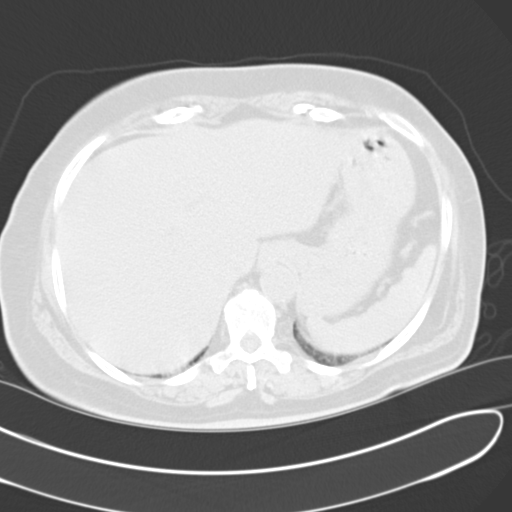
[im 11/50  lung]
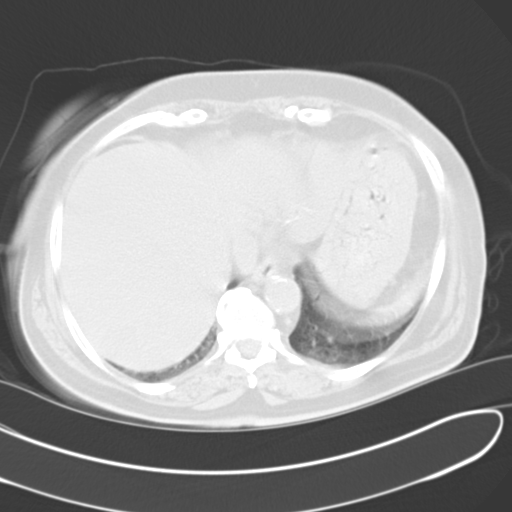
[im 15/50  lung]
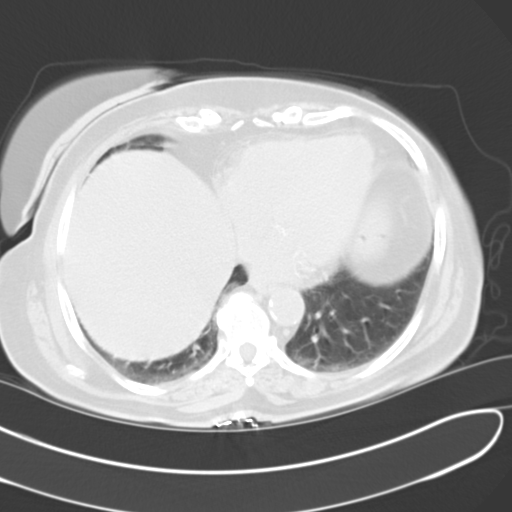
[im 19/50  mediastinal]
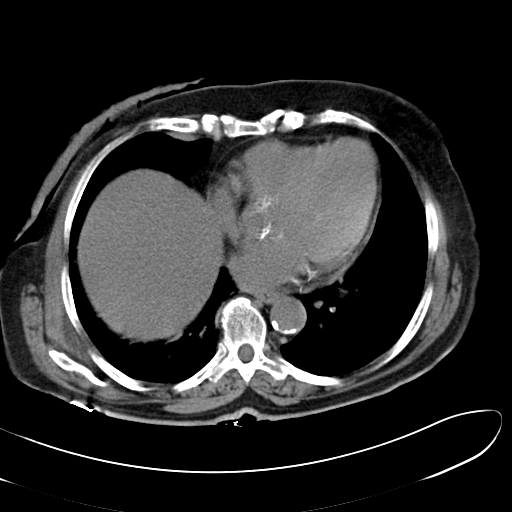
[im 19/50  lung]
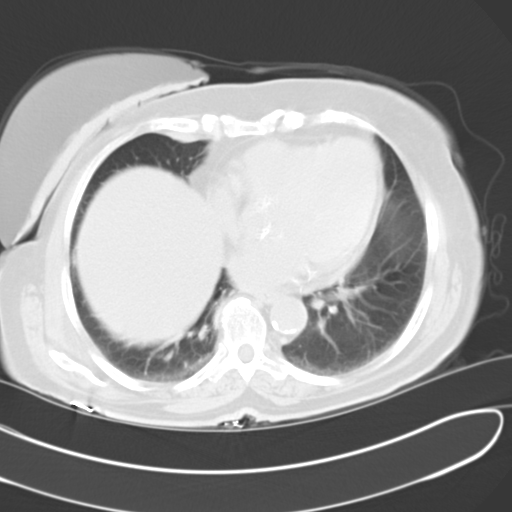
[im 22/50  lung]
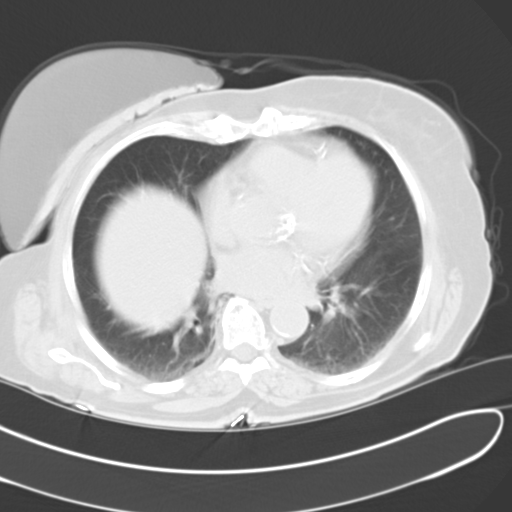
[im 28/50  lung]
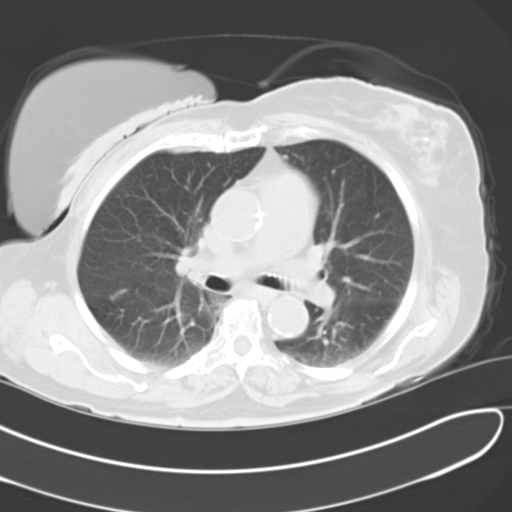
[im 31/50  lung]
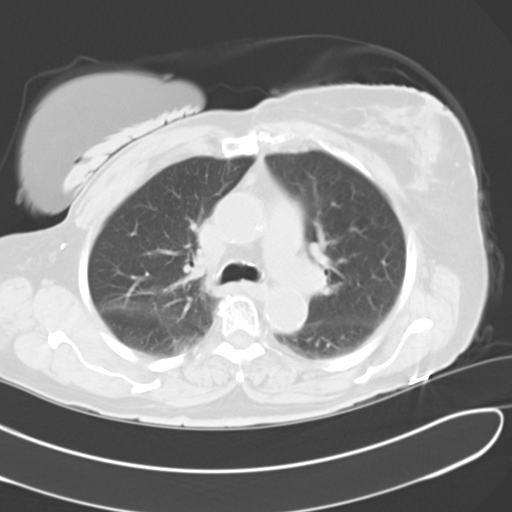
[im 35/50  mediastinal]
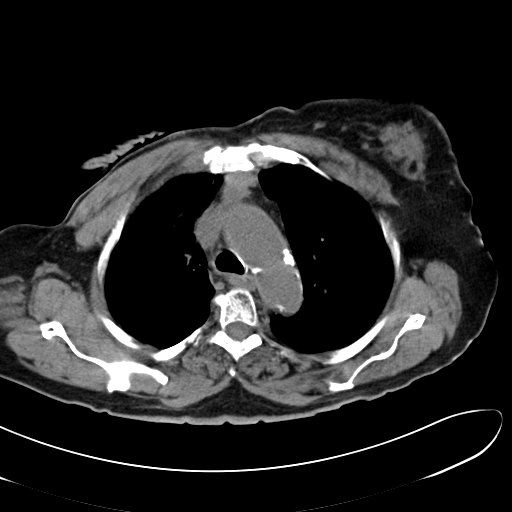
[im 35/50  lung]
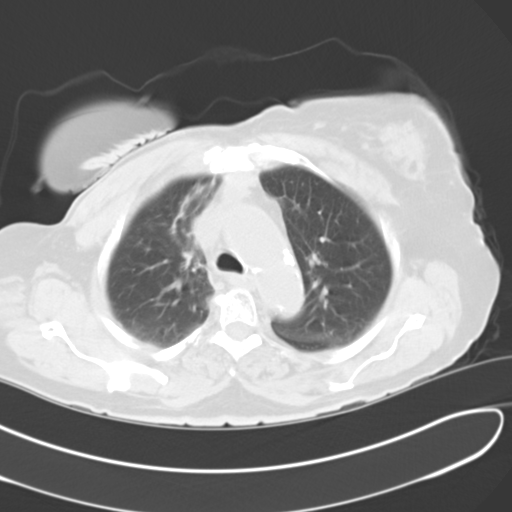
[im 39/50  lung]
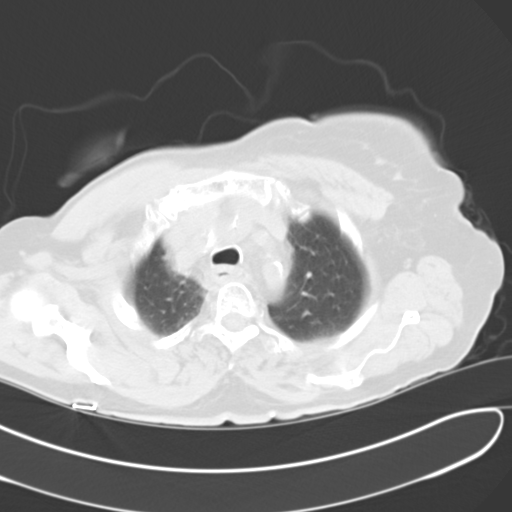
[im 42/50  lung]
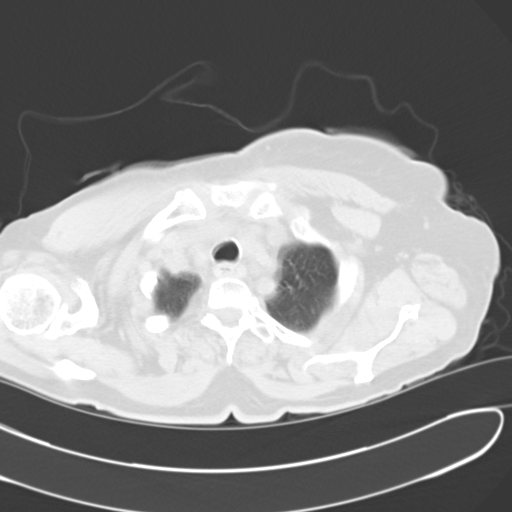
[im 46/50  lung]
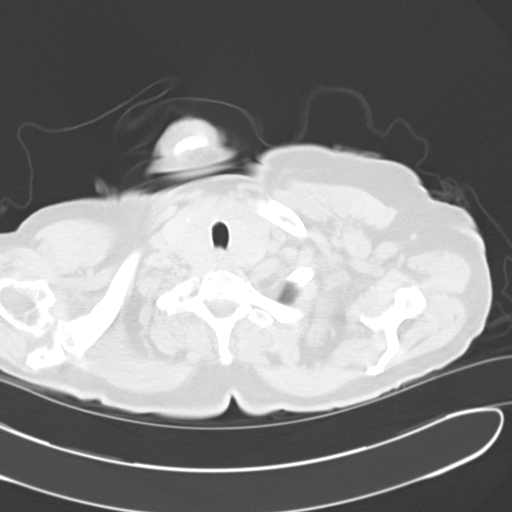

[Series 5: cor routine chest wo · coronal · 0.50mm/px · 3 of 136 slices shown]
[im 28/136  lung]
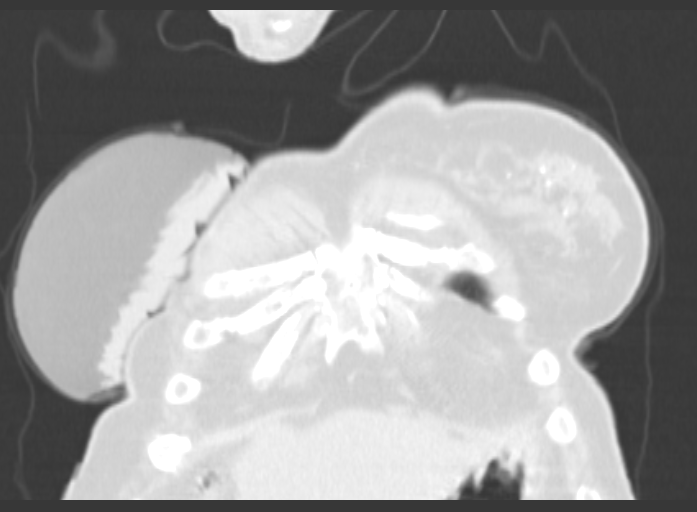
[im 55/136  lung]
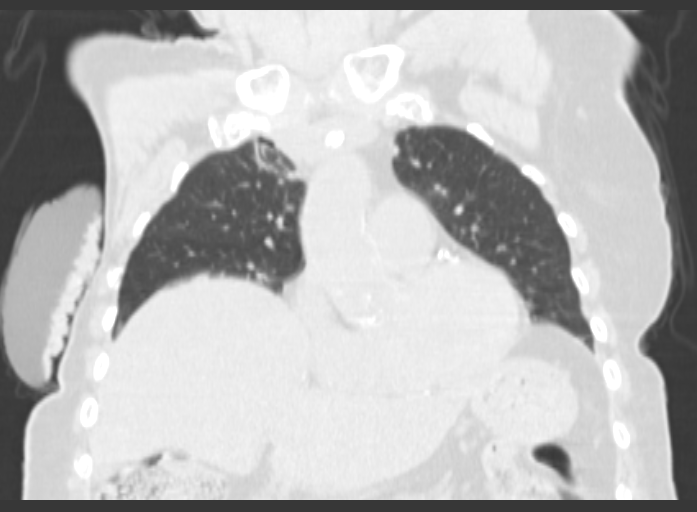
[im 82/136  lung]
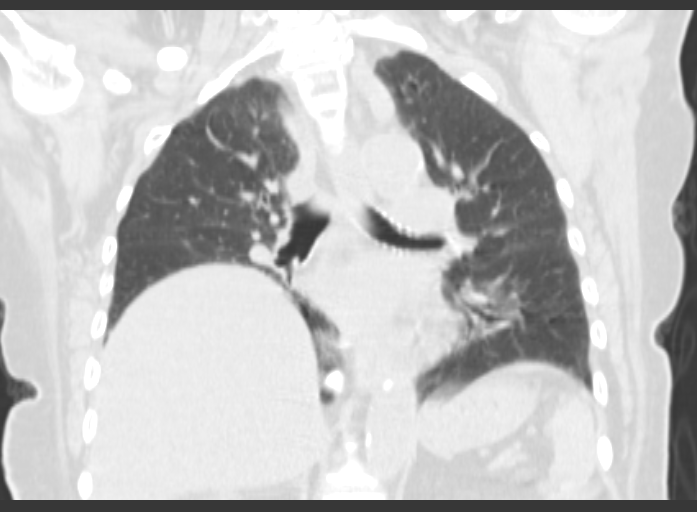

[15 of 36 positions shown; findings below may reference images not displayed]

FINDINGS: Lack of IV contrast may decrease sensitivity and specificity for
detection of malignancy including hilar lymphadenopathy.

Right mastectomy change reidentified. Coarse left breast
calcifications and possible clips again noted. Moderate atheromatous
aortic and coronary arterial calcifications without periaortic
fluid.

Heart size is normal.  Trace pericardial fluid.

Anterior mediastinal mass 2.3 x 1.7 cm image 17, not significantly
changed allowing for differences in technique.

No hilar lymphadenopathy. No axillary lymphadenopathy. Right
axillary clips are noted. No pleural effusion.

Linear right upper lobe scarring is stable. 7 mm probable
intrapulmonary lymph node abutting the right major fissure image 22
stable. Minimal dependent bibasilar atelectasis. No new pulmonary
nodule, mass, or consolidation. Central airways are patent.

Ill-defined sclerosis at the upper sternal body is stable.
IMPRESSION: No significant change in anterior mediastinal presumed metastatic
disease or sclerotic sternal lesion.

## 2015-01-18 DEATH — deceased
